# Patient Record
Sex: Female | Born: 1980 | Race: Black or African American | Hispanic: No | Marital: Single | Smoking: Current some day smoker
Health system: Southern US, Community
[De-identification: ages and names within clinical notes are randomized; demographics above are authoritative.]

## PROBLEM LIST (undated history)

## (undated) DIAGNOSIS — R112 Nausea with vomiting, unspecified: Secondary | ICD-10-CM

## (undated) DIAGNOSIS — M79606 Pain in leg, unspecified: Secondary | ICD-10-CM

## (undated) DIAGNOSIS — M2550 Pain in unspecified joint: Secondary | ICD-10-CM

## (undated) DIAGNOSIS — M549 Dorsalgia, unspecified: Secondary | ICD-10-CM

## (undated) DIAGNOSIS — N809 Endometriosis, unspecified: Secondary | ICD-10-CM

## (undated) DIAGNOSIS — K219 Gastro-esophageal reflux disease without esophagitis: Secondary | ICD-10-CM

## (undated) DIAGNOSIS — R0600 Dyspnea, unspecified: Secondary | ICD-10-CM

## (undated) DIAGNOSIS — Z9889 Other specified postprocedural states: Secondary | ICD-10-CM

## (undated) HISTORY — DX: Pain in leg, unspecified: M79.606

## (undated) HISTORY — DX: Dyspnea, unspecified: R06.00

## (undated) HISTORY — DX: Dorsalgia, unspecified: M54.9

## (undated) HISTORY — DX: Pain in unspecified joint: M25.50

---

## 2001-01-17 HISTORY — PX: OVARIAN CYST SURGERY: SHX726

## 2002-01-29 ENCOUNTER — Inpatient Hospital Stay (HOSPITAL_COMMUNITY): Admission: EM | Admit: 2002-01-29 | Discharge: 2002-01-30 | Payer: Self-pay | Admitting: Emergency Medicine

## 2003-05-01 ENCOUNTER — Emergency Department (HOSPITAL_COMMUNITY): Admission: EM | Admit: 2003-05-01 | Discharge: 2003-05-02 | Payer: Self-pay | Admitting: Emergency Medicine

## 2003-05-20 ENCOUNTER — Encounter: Admission: RE | Admit: 2003-05-20 | Discharge: 2003-05-20 | Payer: Self-pay | Admitting: Orthopedic Surgery

## 2004-03-18 ENCOUNTER — Other Ambulatory Visit: Admission: RE | Admit: 2004-03-18 | Discharge: 2004-03-18 | Payer: Self-pay | Admitting: Gynecology

## 2004-08-20 ENCOUNTER — Emergency Department (HOSPITAL_COMMUNITY): Admission: EM | Admit: 2004-08-20 | Discharge: 2004-08-20 | Payer: Self-pay | Admitting: Family Medicine

## 2011-12-09 ENCOUNTER — Emergency Department (HOSPITAL_COMMUNITY)
Admission: EM | Admit: 2011-12-09 | Discharge: 2011-12-10 | Disposition: A | Discharge status: Home / Self Care | Payer: Self-pay | Attending: Emergency Medicine | Admitting: Emergency Medicine

## 2011-12-09 ENCOUNTER — Encounter (HOSPITAL_COMMUNITY): Payer: Self-pay | Admitting: *Deleted

## 2011-12-09 DIAGNOSIS — R1110 Vomiting, unspecified: Secondary | ICD-10-CM | POA: Insufficient documentation

## 2011-12-09 DIAGNOSIS — Z79899 Other long term (current) drug therapy: Secondary | ICD-10-CM | POA: Insufficient documentation

## 2011-12-09 DIAGNOSIS — F17200 Nicotine dependence, unspecified, uncomplicated: Secondary | ICD-10-CM | POA: Insufficient documentation

## 2011-12-09 DIAGNOSIS — K219 Gastro-esophageal reflux disease without esophagitis: Secondary | ICD-10-CM | POA: Insufficient documentation

## 2011-12-09 DIAGNOSIS — K567 Ileus, unspecified: Secondary | ICD-10-CM

## 2011-12-09 DIAGNOSIS — K560 Paralytic ileus: Secondary | ICD-10-CM | POA: Insufficient documentation

## 2011-12-09 HISTORY — DX: Gastro-esophageal reflux disease without esophagitis: K21.9

## 2011-12-09 LAB — POCT PREGNANCY, URINE: Preg Test, Ur: NEGATIVE

## 2011-12-09 MED ORDER — GI COCKTAIL ~~LOC~~: 30.0000 mL | Freq: Once | ORAL | Status: DC

## 2011-12-09 MED ORDER — FAMOTIDINE 20 MG PO TABS
20.0000 mg | ORAL_TABLET | Freq: Once | ORAL | Status: AC
  Administered 2011-12-09: 20 mg via ORAL
  Filled 2011-12-09: qty 1

## 2011-12-09 MED ORDER — GI COCKTAIL ~~LOC~~
30.0000 mL | Freq: Once | ORAL | Status: AC
  Administered 2011-12-09: 30 mL via ORAL
  Filled 2011-12-09: qty 30

## 2011-12-09 NOTE — ED Notes (Addendum)
Pt states that her acid reflux has been "acting up since Sunday" Pt having difficulty "keeping things down" She feels like her food sits in her throat after she eats it. She has been taking prescribed medications for her reflux with no relief. Pt having difficulty swallowing and catching her breath at times. Denies SOB or pain at this time.

## 2011-12-09 NOTE — ED Notes (Signed)
H/o acid reflux, takes carafate, dx'd in 2011, sx started to return Sunday, reports: pressure in throat, burping, gagging, spits up d/t gagging, (denies: fever, abd chest or back pain, nvd), no relief with carafate. Alert, NAD, calm, interactive, skin W&D, resps e/u, speaking in clear complete sentences. LS CTA, throat (clearly visualized) mildly red & irritated, no swelling or exudate.

## 2011-12-10 ENCOUNTER — Emergency Department (HOSPITAL_COMMUNITY): Payer: Self-pay

## 2011-12-10 LAB — CBC WITH DIFFERENTIAL/PLATELET
Eosinophils Absolute: 0.1 10*3/uL (ref 0.0–0.7)
Eosinophils Relative: 2 % (ref 0–5)
Hemoglobin: 13.7 g/dL (ref 12.0–15.0)
Lymphocytes Relative: 27 % (ref 12–46)
Lymphs Abs: 1.9 10*3/uL (ref 0.7–4.0)
MCH: 29.8 pg (ref 26.0–34.0)
MCV: 86.5 fL (ref 78.0–100.0)
Monocytes Relative: 9 % (ref 3–12)
Neutrophils Relative %: 62 % (ref 43–77)
Platelets: 250 10*3/uL (ref 150–400)
RBC: 4.6 MIL/uL (ref 3.87–5.11)
WBC: 6.8 10*3/uL (ref 4.0–10.5)

## 2011-12-10 LAB — POCT I-STAT, CHEM 8
BUN: 10 mg/dL (ref 6–23)
Calcium, Ion: 1.2 mmol/L (ref 1.12–1.23)
Chloride: 103 mEq/L (ref 96–112)
Glucose, Bld: 83 mg/dL (ref 70–99)
TCO2: 24 mmol/L (ref 0–100)

## 2011-12-10 LAB — HEPATIC FUNCTION PANEL
ALT: 10 U/L (ref 0–35)
Albumin: 4.2 g/dL (ref 3.5–5.2)
Alkaline Phosphatase: 59 U/L (ref 39–117)
Total Bilirubin: 0.6 mg/dL (ref 0.3–1.2)
Total Protein: 7.5 g/dL (ref 6.0–8.3)

## 2011-12-10 MED ORDER — SUCRALFATE 1 GM/10ML PO SUSP: 1.0000 g | Freq: Four times a day (QID) | ORAL | Status: DC

## 2011-12-10 MED ORDER — PROMETHAZINE HCL 12.5 MG RE SUPP: 12.5000 mg | Freq: Four times a day (QID) | RECTAL | Status: DC | PRN

## 2011-12-10 MED ORDER — OMEPRAZOLE 20 MG PO CPDR: 20.0000 mg | DELAYED_RELEASE_CAPSULE | Freq: Every day | ORAL | Status: DC

## 2011-12-10 NOTE — ED Notes (Signed)
TRANSPORTED TO X-RAY. 

## 2011-12-10 NOTE — ED Provider Notes (Addendum)
History     CSN: [id number]  Arrival date & time 12/09/11  2045   First MD Initiated Contact with Patient 12/09/11 2304      Chief Complaint  Patient presents with  . Gastrophageal Reflux    (Consider location/radiation/quality/duration/timing/severity/associated sxs/prior treatment) Patient is a 31 y.o. female presenting with GERD and vomiting. The history is provided by the patient.  Gastrophageal Reflux This is a chronic problem. The current episode started more than 1 week ago. The problem occurs constantly. The problem has not changed since onset.Pertinent negatives include no chest pain, no abdominal pain, no headaches and no shortness of breath. Nothing aggravates the symptoms. Nothing relieves the symptoms. She has tried nothing for the symptoms. The treatment provided no relief.  Emesis  This is a recurrent problem. The current episode started 2 days ago. The problem occurs 2 to 4 times per day. The problem has not changed since onset.The emesis has an appearance of stomach contents. There has been no fever. Pertinent negatives include no abdominal pain, no diarrhea and no headaches.    Past Medical History  Diagnosis Date  . GERD (gastroesophageal reflux disease)     Past Surgical History  Procedure Date  . Ovarian cyst surgery     No family history on file.  History  Substance Use Topics  . Smoking status: Current Every Day Smoker  . Smokeless tobacco: Not on file  . Alcohol Use: No    OB History    Grav Para Term Preterm Abortions TAB SAB Ect Mult Living                  Review of Systems  Respiratory: Negative for shortness of breath.   Cardiovascular: Negative for chest pain.  Gastrointestinal: Positive for vomiting. Negative for abdominal pain and diarrhea.  Neurological: Negative for headaches.  All other systems reviewed and are negative.    Allergies  Review of patient's allergies indicates no known allergies.  Home Medications   Current  Outpatient Rx  Name  Route  Sig  Dispense  Refill  . DM-GUAIFENESIN ER 30-600 MG PO TB12   Oral   Take 1 tablet by mouth every 12 (twelve) hours.         Marland Kitchen ONE-DAILY MULTI VITAMINS PO TABS   Oral   Take 1 tablet by mouth daily.         . SUCRALFATE 1 G PO TABS   Oral   Take 1 g by mouth 4 (four) times daily.           BP 135/89  Pulse 99  Temp 98.2 F (36.8 C) (Oral)  Resp 20  SpO2 100%  LMP 11/16/2011  Physical Exam  Constitutional: She is oriented to person, place, and time. She appears well-developed and well-nourished. No distress.  HENT:  Head: Normocephalic and atraumatic.  Mouth/Throat: Oropharynx is clear and moist.  Eyes: Conjunctivae normal are normal. Pupils are equal, round, and reactive to light.  Neck: Normal range of motion. Neck supple.  Cardiovascular: Normal rate and regular rhythm.   Pulmonary/Chest: Effort normal and breath sounds normal. She has no wheezes. She has no rales.  Abdominal: Soft. Bowel sounds are normal. There is no tenderness. There is no rebound and no guarding.  Musculoskeletal: Normal range of motion.  Neurological: She is alert and oriented to person, place, and time.  Skin: Skin is warm and dry.  Psychiatric: She has a normal mood and affect.    ED Course  Procedures (  including critical care time)   Labs Reviewed  CBC WITH DIFFERENTIAL  HEPATIC FUNCTION PANEL  POCT PREGNANCY, URINE  POCT I-STAT, CHEM 8   No results found.   No diagnosis found.    MDM  Mild ileus, exam vital benign is holding down PO and passing gas and stool.  Will D/c.  Return for pain, intractable vomiting fevers or any concerns.  Patient verbalizes understanding and agrees to follow up        Griffen Frayne K Niyam Bisping-Rasch, MD 12/10/11 0125  Codi Folkerts K Keyshawn Hellwig-Rasch, MD 12/10/11 1610

## 2012-05-04 ENCOUNTER — Emergency Department (HOSPITAL_COMMUNITY)
Admission: EM | Admit: 2012-05-04 | Discharge: 2012-05-04 | Discharge status: Absent without leave | Payer: Self-pay | Source: Home / Self Care

## 2012-08-17 HISTORY — PX: COLPOSCOPY: SHX161

## 2013-12-24 ENCOUNTER — Encounter (HOSPITAL_COMMUNITY): Payer: Self-pay | Admitting: *Deleted

## 2013-12-25 ENCOUNTER — Encounter (HOSPITAL_COMMUNITY): Payer: Self-pay

## 2013-12-25 ENCOUNTER — Ambulatory Visit (HOSPITAL_COMMUNITY)
Admission: RE | Admit: 2013-12-25 | Discharge: 2013-12-25 | Disposition: A | Discharge status: Home / Self Care | Payer: Self-pay | Source: Ambulatory Visit | Attending: Obstetrics and Gynecology | Admitting: Obstetrics and Gynecology

## 2013-12-25 VITALS — BP 116/80 | Temp 98.3°F | Ht 65.0 in | Wt 200.2 lb

## 2013-12-25 DIAGNOSIS — Z1239 Encounter for other screening for malignant neoplasm of breast: Secondary | ICD-10-CM

## 2013-12-25 DIAGNOSIS — R87610 Atypical squamous cells of undetermined significance on cytologic smear of cervix (ASC-US): Secondary | ICD-10-CM

## 2013-12-25 DIAGNOSIS — R87810 Cervical high risk human papillomavirus (HPV) DNA test positive: Secondary | ICD-10-CM

## 2013-12-25 HISTORY — DX: Endometriosis, unspecified: N80.9

## 2013-12-25 NOTE — Patient Instructions (Signed)
Explained to [employer] the follow-up needed for her abnormal Pap smear. Referred patient to the [facility] for a colposcopy per recommendation to follow-up for abnormal Pap smear. Appointment scheduled for Friday, December 27, 2013 at 1000. Patient aware of appointment and will be there. Informed patient she will need a screening mammogram at age 33 unless clinically indicated prior. Smoking cessation discussed and resources given for free smoking cessation classes. Abbe Mazzarella verbalized understanding.  Chaunce Winkels, Arvil Chaco, RN 12:02 PM

## 2013-12-25 NOTE — Progress Notes (Signed)
Patient referred to [facility] by the [facility] Department for follow-up of abnormal Pap smear on 10/01/2013.  Pap Smear: Pap smear not completed today. Last Pap smear was 10/01/2013 at the [facility] Department and ASCUS HPV+. Referred patient to the [place] for a colposcopy per recommendation to follow-up for abnormal Pap smear. Appointment scheduled for Friday, December 27, 2013 at 1000. Per patient has a history of an abnormal Pap smear August 2014 at Dr. Tilden Dome office that required a colposcopy for follow-up. Last Pap smear result is scanned in to [facility] under media.  Physical exam: Breasts Breasts symmetrical. No skin abnormalities bilateral breasts. No nipple retraction bilateral breasts. No nipple discharge bilateral breasts. No lymphadenopathy. No lumps palpated bilateral breasts. No complaints of pain or tenderness on exam. Screening mammogram recommended at age 46 unless clinically indicated prior.  Pelvic/Bimanual No Pap smear completed today since last Pap smear was 10/01/2013. Pap smear not indicated per [facility] guidelines.   Smoking cessation discussed with patient. Referred patient to the [facility] Quitline and gave resources to free classes offered at the [facility].

## 2013-12-27 ENCOUNTER — Encounter: Payer: Self-pay | Admitting: Medical

## 2013-12-27 ENCOUNTER — Ambulatory Visit (INDEPENDENT_AMBULATORY_CARE_PROVIDER_SITE_OTHER): Payer: Self-pay | Admitting: Medical

## 2013-12-27 ENCOUNTER — Other Ambulatory Visit (HOSPITAL_COMMUNITY)
Admission: RE | Admit: 2013-12-27 | Discharge: 2013-12-27 | Disposition: A | Discharge status: Home / Self Care | Payer: Self-pay | Source: Ambulatory Visit | Attending: Medical | Admitting: Medical

## 2013-12-27 DIAGNOSIS — IMO0002 Reserved for concepts with insufficient information to code with codable children: Secondary | ICD-10-CM

## 2013-12-27 DIAGNOSIS — R896 Abnormal cytological findings in specimens from other organs, systems and tissues: Secondary | ICD-10-CM

## 2013-12-27 DIAGNOSIS — N942 Vaginismus: Secondary | ICD-10-CM

## 2013-12-27 LAB — POCT PREGNANCY, URINE: PREG TEST UR: NEGATIVE

## 2013-12-27 NOTE — Progress Notes (Signed)
Pt is concerned about this is her second colposcopy and cervical cancer runs in her family.

## 2013-12-27 NOTE — Patient Instructions (Signed)

## 2013-12-27 NOTE — Progress Notes (Signed)
Patient ID: Cheryl Friedman, female   DOB: 1980/10/11, 33 y.o.   MRN: [id number]     GYNECOLOGY CLINIC COLPOSCOPY PROCEDURE NOTE  33 y.o. G1P0010 here for colposcopy for ASCUS with POSITIVE high risk HPV pap smear on 10/01/13. Discussed role for HPV in cervical dysplasia, need for surveillance. Patient states that this is second colposcopy for abnormal pap smear, last performed last year. Patient has not required LEEP or cryotherapy. Patient encouraged to stop smoking.   Patient given informed consent, signed copy in the chart, time out was performed.  Placed in lithotomy position. Cervix viewed with speculum and colposcope after application of acetic acid.   Colposcopy adequate? Yes  acetowhite lesion(s) noted at 12 o'clock; biopsies obtained.  ECC specimen obtained. All specimens were labelled and sent to pathology.  Patient was given post procedure instructions.  Will follow up pathology and manage accordingly.  Routine preventative health maintenance measures emphasized.  Dr. Kennon Rounds present for procedure  Luvenia Redden, PA-C 12/27/2013 11:17 AM

## 2014-01-01 ENCOUNTER — Telehealth: Payer: Self-pay

## 2014-01-01 NOTE — Telephone Encounter (Signed)
Called patient and informed her of results and recommendations. Patient verbalized understanding. No questions or concerns.  

## 2014-01-01 NOTE — Telephone Encounter (Signed)
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 We will just be watching this, no treatment needed at this time. Repeat pap with HPV in 1 year in our clinic.   Thanks,   Almyra Free

## 2014-01-03 ENCOUNTER — Encounter: Payer: Self-pay | Admitting: *Deleted

## 2014-01-19 ENCOUNTER — Encounter (HOSPITAL_COMMUNITY): Payer: Self-pay | Admitting: Emergency Medicine

## 2014-01-19 ENCOUNTER — Emergency Department (HOSPITAL_COMMUNITY)
Admission: EM | Admit: 2014-01-19 | Discharge: 2014-01-20 | Disposition: A | Discharge status: Home / Self Care | Payer: Self-pay | Attending: Emergency Medicine | Admitting: Emergency Medicine

## 2014-01-19 ENCOUNTER — Emergency Department (HOSPITAL_COMMUNITY): Payer: Self-pay

## 2014-01-19 DIAGNOSIS — Z87891 Personal history of nicotine dependence: Secondary | ICD-10-CM | POA: Insufficient documentation

## 2014-01-19 DIAGNOSIS — Z79899 Other long term (current) drug therapy: Secondary | ICD-10-CM | POA: Insufficient documentation

## 2014-01-19 DIAGNOSIS — R3589 Other polyuria: Secondary | ICD-10-CM

## 2014-01-19 DIAGNOSIS — R000 Tachycardia, unspecified: Secondary | ICD-10-CM | POA: Insufficient documentation

## 2014-01-19 DIAGNOSIS — Z8719 Personal history of other diseases of the digestive system: Secondary | ICD-10-CM | POA: Insufficient documentation

## 2014-01-19 DIAGNOSIS — R69 Illness, unspecified: Secondary | ICD-10-CM

## 2014-01-19 DIAGNOSIS — Z8742 Personal history of other diseases of the female genital tract: Secondary | ICD-10-CM | POA: Insufficient documentation

## 2014-01-19 DIAGNOSIS — Z3202 Encounter for pregnancy test, result negative: Secondary | ICD-10-CM | POA: Insufficient documentation

## 2014-01-19 DIAGNOSIS — R358 Other polyuria: Secondary | ICD-10-CM | POA: Insufficient documentation

## 2014-01-19 DIAGNOSIS — J111 Influenza due to unidentified influenza virus with other respiratory manifestations: Secondary | ICD-10-CM | POA: Insufficient documentation

## 2014-01-19 LAB — CBC WITH DIFFERENTIAL/PLATELET
BASOS ABS: 0 10*3/uL (ref 0.0–0.1)
Basophils Relative: 1 % (ref 0–1)
Eosinophils Absolute: 0.3 10*3/uL (ref 0.0–0.7)
Eosinophils Relative: 5 % (ref 0–5)
HEMATOCRIT: 44.7 % (ref 36.0–46.0)
Hemoglobin: 14.7 g/dL (ref 12.0–15.0)
LYMPHS PCT: 29 % (ref 12–46)
Lymphs Abs: 1.9 10*3/uL (ref 0.7–4.0)
MCH: 29.1 pg (ref 26.0–34.0)
MCHC: 32.9 g/dL (ref 30.0–36.0)
MCV: 88.3 fL (ref 78.0–100.0)
Monocytes Absolute: 0.6 10*3/uL (ref 0.1–1.0)
Monocytes Relative: 9 % (ref 3–12)
NEUTROS ABS: 3.7 10*3/uL (ref 1.7–7.7)
Neutrophils Relative %: 56 % (ref 43–77)
PLATELETS: 283 10*3/uL (ref 150–400)
RBC: 5.06 MIL/uL (ref 3.87–5.11)
RDW: 12.8 % (ref 11.5–15.5)
WBC: 6.6 10*3/uL (ref 4.0–10.5)

## 2014-01-19 LAB — URINALYSIS, ROUTINE W REFLEX MICROSCOPIC
Bilirubin Urine: NEGATIVE
Glucose, UA: NEGATIVE mg/dL
KETONES UR: NEGATIVE mg/dL
NITRITE: NEGATIVE
PH: 7 (ref 5.0–8.0)
Protein, ur: NEGATIVE mg/dL
SPECIFIC GRAVITY, URINE: 1.024 (ref 1.005–1.030)
UROBILINOGEN UA: 1 mg/dL (ref 0.0–1.0)

## 2014-01-19 LAB — COMPREHENSIVE METABOLIC PANEL
ALBUMIN: 4.1 g/dL (ref 3.5–5.2)
ALT: 13 U/L (ref 0–35)
AST: 19 U/L (ref 0–37)
Alkaline Phosphatase: 73 U/L (ref 39–117)
Anion gap: 6 (ref 5–15)
BILIRUBIN TOTAL: 0.5 mg/dL (ref 0.3–1.2)
BUN: 7 mg/dL (ref 6–23)
CO2: 26 mmol/L (ref 19–32)
CREATININE: 0.91 mg/dL (ref 0.50–1.10)
Calcium: 9.4 mg/dL (ref 8.4–10.5)
Chloride: 103 mEq/L (ref 96–112)
GFR calc Af Amer: >90 mL/min (ref >90)
GFR, EST NON AFRICAN AMERICAN: 82 mL/min — AB (ref >90)
Glucose, Bld: 93 mg/dL (ref 70–99)
Potassium: 4 mmol/L (ref 3.5–5.1)
Sodium: 135 mmol/L (ref 135–145)
Total Protein: 7.5 g/dL (ref 6.0–8.3)

## 2014-01-19 LAB — URINE MICROSCOPIC-ADD ON

## 2014-01-19 LAB — POC URINE PREG, ED: Preg Test, Ur: NEGATIVE

## 2014-01-19 MED ORDER — PREDNISONE 20 MG PO TABS: 60.0000 mg | ORAL_TABLET | Freq: Every day | ORAL | Status: AC

## 2014-01-19 MED ORDER — IBUPROFEN 800 MG PO TABS: 800.0000 mg | ORAL_TABLET | Freq: Three times a day (TID) | ORAL | Status: DC

## 2014-01-19 MED ORDER — IBUPROFEN 800 MG PO TABS: 800.0000 mg | ORAL_TABLET | Freq: Three times a day (TID) | ORAL | Status: AC

## 2014-01-19 MED ORDER — SODIUM CHLORIDE 0.9 % IV BOLUS (SEPSIS)
1000.0000 mL | Freq: Once | INTRAVENOUS | Status: AC
  Administered 2014-01-19: 1000 mL via INTRAVENOUS

## 2014-01-19 MED ORDER — KETOROLAC TROMETHAMINE 30 MG/ML IJ SOLN
30.0000 mg | Freq: Once | INTRAMUSCULAR | Status: AC
  Administered 2014-01-19: 30 mg via INTRAVENOUS
  Filled 2014-01-19: qty 1

## 2014-01-19 MED ORDER — DEXAMETHASONE SODIUM PHOSPHATE 4 MG/ML IJ SOLN
10.0000 mg | Freq: Once | INTRAMUSCULAR | Status: AC
  Administered 2014-01-19: 10 mg via INTRAVENOUS
  Filled 2014-01-19: qty 3

## 2014-01-19 MED ORDER — FOSFOMYCIN TROMETHAMINE 3 G PO PACK
3.0000 g | PACK | Freq: Once | ORAL | Status: AC
  Administered 2014-01-19: 3 g via ORAL
  Filled 2014-01-19: qty 3

## 2014-01-19 NOTE — ED Provider Notes (Signed)
CSN: [id number]     Arrival date & time 01/19/14  1902 History   First MD Initiated Contact with Patient 01/19/14 2054     Chief Complaint  Patient presents with  . Emesis  . Fever  . Nasal Congestion     (Consider location/radiation/quality/duration/timing/severity/associated sxs/prior Treatment) HPI This patient presents with 5 days of generalized discomfort, including diffuse soreness, nausea, generalized weakness, chills, cough, congestion. Symptoms began approximately 2 weeks after the patient had a similar illness. Since onset, no relief with OTC medication. Patient is generally well, denies substantial medical problems.  Past Medical History  Diagnosis Date  . GERD (gastroesophageal reflux disease)   . Endometriosis    Past Surgical History  Procedure Laterality Date  . Ovarian cyst surgery  2003  . Colposcopy  08/2012   Family History  Problem Relation Age of Onset  . Hypertension Maternal Aunt   . Hypertension Maternal Grandmother   . Hypertension Maternal Aunt    History  Substance Use Topics  . Smoking status: Former Smoker -- 0.25 packs/day    Types: Cigarettes    Quit date: 10/17/2013  . Smokeless tobacco: Never Used  . Alcohol Use: No   OB History    Gravida Para Term Preterm AB TAB SAB Ectopic Multiple Living   1    1  1         Review of Systems  Constitutional:       Per HPI, otherwise negative  HENT:       Per HPI, otherwise negative  Respiratory:       Per HPI, otherwise negative  Cardiovascular:       Per HPI, otherwise negative  Gastrointestinal: Negative for vomiting.  Endocrine:       Negative aside from HPI  Genitourinary:       Neg aside from HPI   Musculoskeletal:       Per HPI, otherwise negative  Skin: Negative.   Neurological: Negative for syncope.      Allergies  Latex  Home Medications   Prior to Admission medications   Medication Sig Start Date End Date Taking? Authorizing Provider  dextromethorphan-guaiFENesin  (MUCINEX DM) 30-600 MG per 12 hr tablet Take 1 tablet by mouth every 12 (twelve) hours.    Historical Provider, MD  ibuprofen (ADVIL,MOTRIN) 800 MG tablet Take 1 tablet (800 mg total) by mouth 3 (three) times daily. 01/19/14 01/23/14  Carmin Muskrat, MD  medroxyPROGESTERone (DEPO-SUBQ PROVERA 104) 104 MG/0.65ML injection Inject 104 mg into the skin every 3 (three) months.    Historical Provider, MD  Multiple Vitamin (MULTIVITAMIN) tablet Take 1 tablet by mouth daily.    Historical Provider, MD   BP 129/105 mmHg  Pulse 95  Temp(Src) 99.1 F (37.3 C) (Oral)  Resp 20  Ht 5\' 5"  (1.651 m)  Wt 195 lb (88.451 kg)  BMI 32.45 kg/m2  SpO2 100% Physical Exam  Constitutional: She is oriented to person, place, and time. She appears well-developed and well-nourished. No distress.  HENT:  Head: Normocephalic and atraumatic.  Eyes: Conjunctivae and EOM are normal.  Cardiovascular: Regular rhythm.  Tachycardia present.   Pulmonary/Chest: Effort normal and breath sounds normal. No stridor. No respiratory distress.  Abdominal: She exhibits no distension.  Musculoskeletal: She exhibits no edema.  Neurological: She is alert and oriented to person, place, and time. No cranial nerve deficit.  Skin: Skin is warm and dry.  Psychiatric: She has a normal mood and affect.  Nursing note and vitals reviewed.  ED Course  Procedures (including critical care time) Labs Review Labs Reviewed  COMPREHENSIVE METABOLIC PANEL - Abnormal; Notable for the following:    GFR calc non Af Amer 82 (*)    All other components within normal limits  URINALYSIS, ROUTINE W REFLEX MICROSCOPIC - Abnormal; Notable for the following:    APPearance CLOUDY (*)    Hgb urine dipstick SMALL (*)    Leukocytes, UA MODERATE (*)    All other components within normal limits  URINE MICROSCOPIC-ADD ON - Abnormal; Notable for the following:    Squamous Epithelial / LPF FEW (*)    Bacteria, UA FEW (*)    All other components within normal  limits  CBC WITH DIFFERENTIAL  POC URINE PREG, ED    Imaging Review Dg Chest 2 View  01/19/2014   CLINICAL DATA:  Emesis, fever, and nasal congestion for 6 days. Shortness of breath and chest pressure.  EXAM: CHEST  2 VIEW  COMPARISON:  12/10/2011  FINDINGS: The heart size and mediastinal contours are within normal limits. Both lungs are clear. The visualized skeletal structures are unremarkable.  IMPRESSION: No active cardiopulmonary disease.   Electronically Signed   By: Lucienne Capers M.D.   On: 01/19/2014 21:40   On repeat exam the patient states that she feels better. She now acknowledges polyuria, denies dysuria. With abnormal urinalysis, patient received fosfomycin, urine culture will be sent.   MDM   Final diagnoses:  Influenza-like illness  Polyuria    Patient presents with several episodes of flulike symptoms. Here the patient is afebrile, but initially tachycardic. Patient's vital signs improved, she remains in no distress, and has no alarming findings. Patient did have mild polyuria, per report, and was treated for possible urinary tract infection. With otherwise reassuring results, patient was treated symptomatically, discharged in stable condition to follow-up with primary care.     Carmin Muskrat, MD 01/19/14 2350

## 2014-01-19 NOTE — Discharge Instructions (Signed)
As discussed, your evaluation today has been largely reassuring.  But, it is important that you monitor your condition carefully, and do not hesitate to return to the ED if you develop new, or concerning changes in your condition. ? ?Otherwise, please follow-up with your physician for appropriate ongoing care. ? ?

## 2014-01-19 NOTE — ED Notes (Signed)
Denies nausea at this time.  Last vomited this morning.

## 2014-01-19 NOTE — ED Notes (Signed)
Reports "flu-like symptoms" 3 weeks ago that improved and is now getting worse.  Reports fever, chills, vomiting, decreased appetite, dry cough, sore throat, body aches, and congestion.

## 2014-01-19 NOTE — ED Notes (Signed)
Correction- add on urine culture is clean catch

## 2014-01-21 LAB — URINE CULTURE

## 2014-10-15 ENCOUNTER — Encounter (HOSPITAL_COMMUNITY): Payer: Self-pay | Admitting: *Deleted

## 2014-11-05 ENCOUNTER — Other Ambulatory Visit (HOSPITAL_COMMUNITY)
Admission: RE | Admit: 2014-11-05 | Discharge: 2014-11-05 | Disposition: A | Discharge status: Home / Self Care | Payer: Self-pay | Source: Ambulatory Visit | Attending: Family Medicine | Admitting: Family Medicine

## 2014-11-05 ENCOUNTER — Ambulatory Visit (INDEPENDENT_AMBULATORY_CARE_PROVIDER_SITE_OTHER): Payer: Self-pay | Admitting: Family Medicine

## 2014-11-05 ENCOUNTER — Encounter: Payer: Self-pay | Admitting: Family Medicine

## 2014-11-05 VITALS — BP 137/97 | HR 93 | Temp 98.9°F | Ht 61.0 in | Wt 204.9 lb

## 2014-11-05 DIAGNOSIS — R87619 Unspecified abnormal cytological findings in specimens from cervix uteri: Secondary | ICD-10-CM | POA: Insufficient documentation

## 2014-11-05 DIAGNOSIS — Z3202 Encounter for pregnancy test, result negative: Secondary | ICD-10-CM

## 2014-11-05 DIAGNOSIS — IMO0002 Reserved for concepts with insufficient information to code with codable children: Secondary | ICD-10-CM | POA: Insufficient documentation

## 2014-11-05 DIAGNOSIS — B977 Papillomavirus as the cause of diseases classified elsewhere: Secondary | ICD-10-CM

## 2014-11-05 DIAGNOSIS — R8789 Other abnormal findings in specimens from female genital organs: Secondary | ICD-10-CM

## 2014-11-05 LAB — POCT PREGNANCY, URINE: Preg Test, Ur: NEGATIVE

## 2014-11-05 MED ORDER — NORETHINDRONE-ETH ESTRADIOL 1-5 MG-MCG PO TABS: 1.0000 | ORAL_TABLET | Freq: Every day | ORAL | Status: AC

## 2014-11-05 NOTE — Progress Notes (Signed)
   CLINIC ENCOUNTER NOTE  History:  34 y.o. G1P0010 here today for colpo. She denies any abnormal vaginal discharge, bleeding, pelvic pain or other concerns.  She had multiple questions about colpo. She reports she was recently taken off depo and is on a "birth control pill" but does not know name.   She is currently not smoking and using nicotine patches.   See overview of Abnormal pap in problem list of details of testing.  Past Medical History  Diagnosis Date  . GERD (gastroesophageal reflux disease)   . Endometriosis     Past Surgical History  Procedure Laterality Date  . Ovarian cyst surgery  2003  . Colposcopy  08/2012    The following portions of the patient's history were reviewed and updated as appropriate: allergies, current medications, past family history, past medical history, past social history, past surgical history and problem list.     Review of Systems:  Pertinent items noted in HPI and remainder of comprehensive ROS otherwise negative.  Objective:  Physical Exam BP 137/97 mmHg  Pulse 93  Temp(Src) 98.9 F (37.2 C) (Oral)  Ht 5\' 1"  (1.549 m)  Wt 204 lb 14.4 oz (92.942 kg)  BMI 38.74 kg/m2 CONSTITUTIONAL: Well-developed, well-nourished female in no acute distress.  HENT:  Normocephalic, atraumatic. External right and left ear normal. Oropharynx is clear and moist EYES: Conjunctivae and EOM are normal. Pupils are equal, round, and reactive to light. No scleral icterus.  NECK: Normal range of motion, supple, no masses SKIN: Skin is warm and dry. No rash noted. Not diaphoretic. No erythema. No pallor. [place]: Alert and oriented to person, place, and time. Normal reflexes, muscle tone coordination. No cranial nerve deficit noted. PSYCHIATRIC: Normal mood and affect. Normal behavior. Normal judgment and thought content. CARDIOVASCULAR: Normal heart rate noted RESPIRATORY: Effort and breath sounds normal, no problems with respiration noted ABDOMEN: Soft, no  distention noted.   PELVIC: Normal appearing external genitalia; normal appearing vaginal mucosa and cervix.  No abnormal discharge noted.  Normal uterine size, no other palpable masses, no uterine or adnexal tenderness. MUSCULOSKELETAL: Normal range of motion. No edema noted.  GYNECOLOGY CLINIC PROCEDURE NOTE Patient given informed consent, signed copy in the chart, time out was performed.  Placed in lithotomy position. Cervix viewed with speculum and colposcope after application of acetic acid.   Colposcopy adequate? Yes  acetowhite lesion(s) noted at 5-6 o'clock and two small lesions at 2 o'clock; corresponding biopsies obtained.  ECC specimen obtained. All specimens were labelled and sent to pathology. Dr. Verita Schneiders examined patient and agreed with biopsy locations.   Patient was given post procedure instructions.  Will follow up pathology and manage accordingly.  Routine preventative health maintenance measures emphasized.   Assessment & Plan:  1. 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 Tobacco Cessation: Reviewed importance of stopping smoking.  3. Contraceptive planning: reviewed current plan.   Routine preventative health maintenance measures emphasized. Please refer to After Visit Summary for other counseling recommendations.   Return if symptoms worsen or fail to improve.  Total face-to-face time with patient: 30 minutes. Over 50% of encounter was spent on counseling and coordination of care.

## 2014-11-05 NOTE — Patient Instructions (Signed)
Colposcopy  Colposcopy is a procedure to examine your cervix and vagina, or the area around the outside of your vagina, for abnormalities or signs of disease. The procedure is done using a lighted microscope called a colposcope. Tissue samples may be collected during the colposcopy if your health care provider finds any unusual cells. A colposcopy may be done if a woman has:  · An abnormal Pap test. A Pap test is a medical test done to evaluate cells that are on the surface of the cervix.  · A Pap test result that is suggestive of human papillomavirus (HPV). This virus can cause genital warts and is linked to the development of cervical cancer.  · A sore on her cervix and the results of a Pap test were normal.  · Genital warts on the cervix or in or around the outside of the vagina.  · A mother who took the drug diethylstilbestrol (DES) while pregnant.  · Painful intercourse.  · Vaginal bleeding, especially after sexual intercourse.  LET YOUR HEALTH CARE PROVIDER KNOW ABOUT:  · Any allergies you have.  · All medicines you are taking, including vitamins, herbs, eye drops, creams, and over-the-counter medicines.  · Previous problems you or members of your family have had with the use of anesthetics.  · Any blood disorders you have.  · Previous surgeries you have had.  · Medical conditions you have.  RISKS AND COMPLICATIONS  Generally, a colposcopy is a safe procedure. However, as with any procedure, complications can occur. Possible complications include:  · Bleeding.  · Infection.  · Missed lesions.  BEFORE THE PROCEDURE   · Tell your health care provider if you have your menstrual period. A colposcopy typically is not done during menstruation.  · For 24 hours before the colposcopy, do not:    [place]ou[place].    Use tampons.    Use medicines, creams, or suppositories in the vagina.    Have sexual intercourse.  PROCEDURE   During the procedure, you will be lying on your back with your feet in foot rests (stirrups). A warm  metal or plastic instrument (speculum) will be placed in your vagina to keep it open and to allow the health care provider to see the cervix. The colposcope will be placed outside the vagina. It will be used to magnify and examine the cervix, vagina, and the area around the outside of the vagina. A small amount of liquid solution will be placed on the area that is to be viewed. This solution will make it easier to see the abnormal cells. Your health care provider will use tools to suck out mucus and cells from the canal of the cervix. Then he or she will record the location of the abnormal areas.  If a biopsy is done during the procedure, a medicine will usually be given to numb the area (local anesthetic). You may feel mild pain or cramping while the biopsy is done. After the procedure, tissue samples collected during the biopsy will be sent to a lab for analysis.  AFTER THE PROCEDURE   You will be given instructions on when to follow up with your health care provider for your test results. It is important to keep your appointment.     This information is not intended to replace advice given to you by your health care provider. Make sure you discuss any questions you have with your health care provider.     Document Released: 03/26/2002 Document Revised: 09/05/2012 Document Reviewed: 08/02/2012    Elsevier Interactive Patient Education ©2016 [employer]

## 2014-11-13 ENCOUNTER — Telehealth: Payer: Self-pay | Admitting: Family Medicine

## 2014-11-13 DIAGNOSIS — IMO0002 Reserved for concepts with insufficient information to code with codable children: Secondary | ICD-10-CM

## 2014-11-13 NOTE — Telephone Encounter (Signed)
Patient with persistent CIN 1. Will need 12 month follow up pap with HPV testing.  If persistent CIN 1> 2 years (2017) then we can consider treatment with ablative or excision methods.   Caren Macadam, MD

## 2014-11-17 NOTE — Telephone Encounter (Signed)
3:54 PM Spoke with patient and reviewed results from recent colposcopy.  Patient will follow up in Sept 2017 for pap with HPV. If positive will need colposcopy. If this shows continued CIN 1 we can consider treatment with cryo or leep as the patient will have had persistent dysplasia for 2 years.  Caren Macadam, MD

## 2014-11-17 NOTE — Telephone Encounter (Signed)
Patient returned call here at [facility]. Cheryl Friedman, ASA

## 2015-07-27 ENCOUNTER — Encounter (HOSPITAL_COMMUNITY): Payer: Self-pay | Admitting: *Deleted

## 2016-01-21 ENCOUNTER — Encounter: Payer: Self-pay | Admitting: Physician Assistant

## 2016-01-21 ENCOUNTER — Ambulatory Visit: Payer: Self-pay | Admitting: Physician Assistant

## 2016-01-21 VITALS — BP 110/80 | HR 73 | Temp 98.4°F

## 2016-01-21 DIAGNOSIS — J069 Acute upper respiratory infection, unspecified: Secondary | ICD-10-CM

## 2016-01-21 LAB — POCT INFLUENZA A/B
INFLUENZA A, POC: NEGATIVE
Influenza B, POC: NEGATIVE

## 2016-01-21 MED ORDER — AZITHROMYCIN 250 MG PO TABS: ORAL_TABLET | ORAL | 0 refills | Status: DC

## 2016-01-21 MED ORDER — FLUCONAZOLE 150 MG PO TABS: 150.0000 mg | ORAL_TABLET | Freq: Once | ORAL | 0 refills | Status: AC

## 2016-01-21 MED ORDER — FLUCONAZOLE 150 MG PO TABS: 150.0000 mg | ORAL_TABLET | Freq: Once | ORAL | 0 refills | Status: DC

## 2016-01-21 MED ORDER — BENZONATATE 200 MG PO CAPS: 200.0000 mg | ORAL_CAPSULE | Freq: Three times a day (TID) | ORAL | 0 refills | Status: DC | PRN

## 2016-01-21 NOTE — Progress Notes (Signed)
S: C/o runny nose and congestion with nighttime fevers and body aches for 3 days, denies cp/sob, v/d; mucus was green  Using otc meds: cold meds  O: PE: vitals wnl, nad, perrl eomi, normocephalic, tms dull, nasal mucosa red and swollen, throat injected, neck supple no lymph, lungs c t a, cv rrr, neuro intact, flu swab neg  A:  Acute  uri   P: drink fluids, continue regular meds , use otc meds of choice, return if not improving in 5 days, return earlier if worsening  Zpack, tessalon perls, diflucan if needed

## 2016-01-28 ENCOUNTER — Encounter: Payer: Self-pay | Admitting: Physician Assistant

## 2016-01-28 ENCOUNTER — Ambulatory Visit: Payer: Self-pay | Admitting: Physician Assistant

## 2016-01-28 VITALS — BP 110/60 | HR 99 | Temp 98.3°F

## 2016-01-28 DIAGNOSIS — J0100 Acute maxillary sinusitis, unspecified: Secondary | ICD-10-CM

## 2016-01-28 MED ORDER — AMOXICILLIN 875 MG PO TABS: 875.0000 mg | ORAL_TABLET | Freq: Two times a day (BID) | ORAL | 0 refills | Status: DC

## 2016-01-28 MED ORDER — PREDNISONE 10 MG PO TABS: 30.0000 mg | ORAL_TABLET | Freq: Every day | ORAL | 0 refills | Status: DC

## 2016-01-28 MED ORDER — FLUCONAZOLE 150 MG PO TABS: 150.0000 mg | ORAL_TABLET | Freq: Once | ORAL | 0 refills | Status: AC

## 2016-01-28 NOTE — Progress Notes (Signed)
S: C/o runny nose and congestion for 3 days, had bronchitis last week, is a little better from the zpack, but as soon as she finished the antibiotic the head congestion started, no fever, chills, cp/sob, v/d; mucus is green. Bloody and thick, cough is sporadic, c/o of facial and dental pain.   Using otc meds:   O: PE: vitals wnl, nad perrl eomi, normocephalic, tms dull, nasal mucosa red and swollen, throat injected, neck supple no lymph, lungs c t a, cv rrr, neuro intact  A:  Acute sinusitis   P: drink fluids, continue regular meds , use otc meds of choice, return if not improving in 5 days, return earlier if worsening , prednisone 30mg , amoxil, diflucan

## 2016-02-22 ENCOUNTER — Ambulatory Visit
Admission: RE | Admit: 2016-02-22 | Discharge: 2016-02-22 | Disposition: A | Discharge status: Home / Self Care | Payer: Managed Care, Other (non HMO) | Source: Ambulatory Visit | Attending: Physician Assistant | Admitting: Physician Assistant

## 2016-02-22 ENCOUNTER — Encounter: Payer: Self-pay | Admitting: Physician Assistant

## 2016-02-22 ENCOUNTER — Ambulatory Visit: Payer: Self-pay | Admitting: Physician Assistant

## 2016-02-22 VITALS — BP 120/70 | HR 89 | Temp 98.6°F

## 2016-02-22 DIAGNOSIS — J209 Acute bronchitis, unspecified: Secondary | ICD-10-CM | POA: Insufficient documentation

## 2016-02-22 DIAGNOSIS — R05 Cough: Secondary | ICD-10-CM | POA: Diagnosis present

## 2016-02-22 MED ORDER — ALBUTEROL SULFATE HFA 108 (90 BASE) MCG/ACT IN AERS: 2.0000 | INHALATION_SPRAY | Freq: Four times a day (QID) | RESPIRATORY_TRACT | 0 refills | Status: DC | PRN

## 2016-02-22 MED ORDER — METHYLPREDNISOLONE 4 MG PO TBPK: ORAL_TABLET | ORAL | 0 refills | Status: DC

## 2016-02-22 NOTE — Progress Notes (Signed)
S: C/o cough and congestion with wheezing and chest pain, chest is sore from coughing, denies fever, chills; or cough is dry and hacking; keeping pt awake at night;  denies cardiac type chest pain or sob, v/d, abd pain, sick since Jan, used zpack, amoxil, and 3d pred burst, green mucus is gone but still has head congestion, cough and wheezing Remainder ros neg  O: vitals wnl, nad, tms clear, throat injected, neck supple no lymph, lungs with wheezing, cv rrr, neuro intact  A:  Acute bronchitis   P:  rx medication: albuterol inhaler, medrol dose pack, cxr ap and lateral, use otc meds, tylenol or motrin as needed for fever/chills, return if not better in 3 -5 days, return earlier if worsening, consider ENT referral for head congestion if not improving with otc meds

## 2016-03-30 ENCOUNTER — Other Ambulatory Visit: Payer: Self-pay | Admitting: Obstetrics and Gynecology

## 2016-03-30 DIAGNOSIS — R1900 Intra-abdominal and pelvic swelling, mass and lump, unspecified site: Secondary | ICD-10-CM

## 2016-04-04 ENCOUNTER — Other Ambulatory Visit: Payer: Self-pay

## 2016-04-15 ENCOUNTER — Ambulatory Visit
Admission: RE | Admit: 2016-04-15 | Discharge: 2016-04-15 | Disposition: A | Discharge status: Home / Self Care | Payer: Managed Care, Other (non HMO) | Source: Ambulatory Visit | Attending: Obstetrics and Gynecology | Admitting: Obstetrics and Gynecology

## 2016-04-15 DIAGNOSIS — R1900 Intra-abdominal and pelvic swelling, mass and lump, unspecified site: Secondary | ICD-10-CM

## 2016-04-15 MED ORDER — IOPAMIDOL (ISOVUE-300) INJECTION 61%
100.0000 mL | Freq: Once | INTRAVENOUS | Status: AC | PRN
  Administered 2016-04-15: 100 mL via INTRAVENOUS

## 2016-04-25 ENCOUNTER — Other Ambulatory Visit: Payer: Self-pay | Admitting: Obstetrics and Gynecology

## 2016-04-25 DIAGNOSIS — Q519 Congenital malformation of uterus and cervix, unspecified: Secondary | ICD-10-CM

## 2016-04-29 ENCOUNTER — Ambulatory Visit
Admission: RE | Admit: 2016-04-29 | Discharge: 2016-04-29 | Disposition: A | Discharge status: Home / Self Care | Payer: Managed Care, Other (non HMO) | Source: Ambulatory Visit | Attending: Obstetrics and Gynecology | Admitting: Obstetrics and Gynecology

## 2016-04-29 DIAGNOSIS — Q519 Congenital malformation of uterus and cervix, unspecified: Secondary | ICD-10-CM

## 2016-04-29 IMAGING — MR MR PELVIS WO/W CM
7 of 10 series · 36 of 48 positions shown · IV contrast (multihance)
Comparison: CT [DATE].  Outside ultrasound not available.

CLINICAL DATA: Possible left ovarian mass on outside ultrasound. CT
demonstrating findings suspicious for uterine duplication anomaly.

EXAM:
MRI PELVIS WITHOUT AND WITH CONTRAST
TECHNIQUE: Multiplanar multisequence MR imaging of the pelvis was performed
both before and after administration of intravenous contrast.
CONTRAST:  20 cc MultiHance

[Series 3: cor haste · coronal · 6.0mm · 0.78mm/px · 6 of 26 slices shown]
[im 1/26]
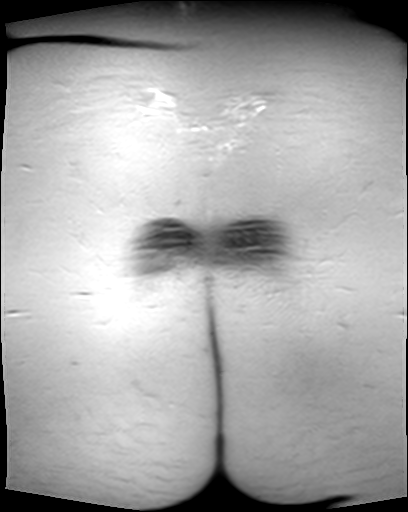
[im 6/26]
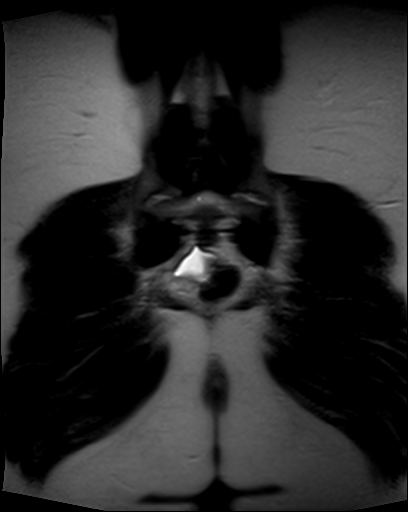
[im 11/26]
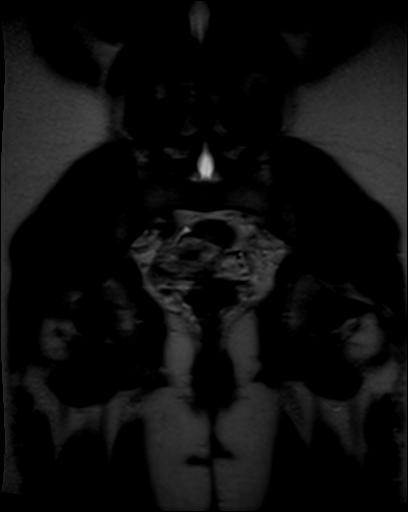
[im 16/26]
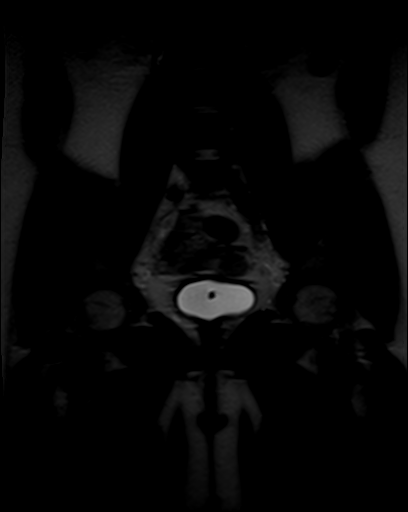
[im 21/26]
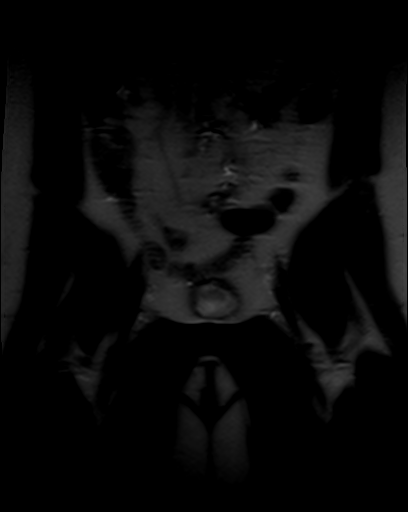
[im 26/26]
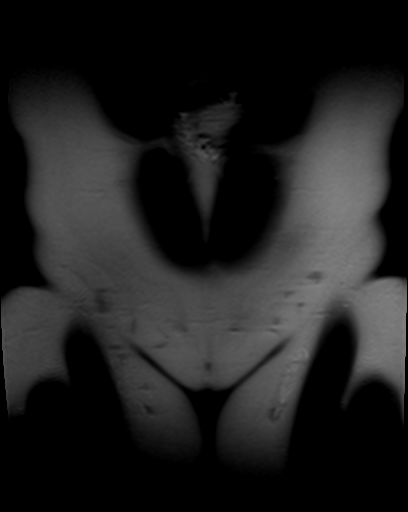

[Series 4: t2_tse_sag · sagittal · 5.0mm · 0.57mm/px · 4 of 20 slices shown]
[im 1/20]
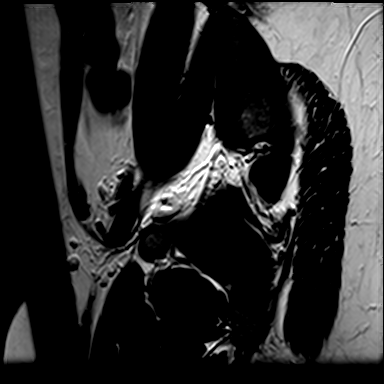
[im 7/20]
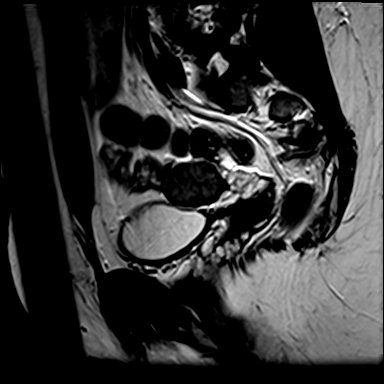
[im 13/20]
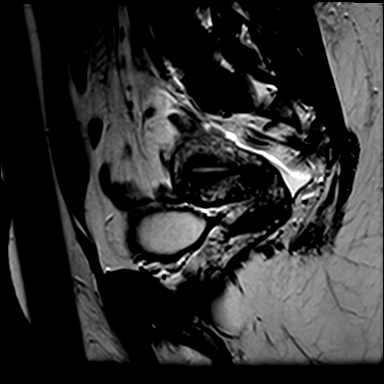
[im 20/20]
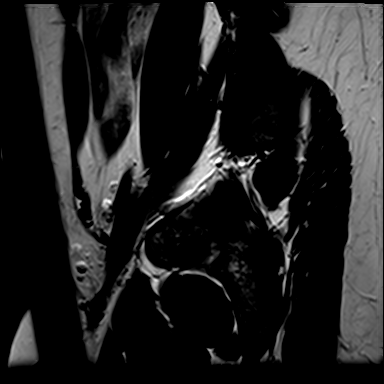

[Series 7: T2 · coronal · 5.0mm · 0.86mm/px · 5 of 21 slices shown]
[im 1/21]
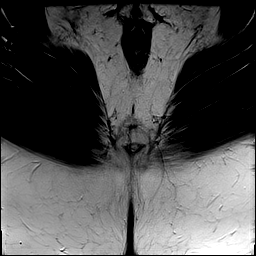
[im 6/21]
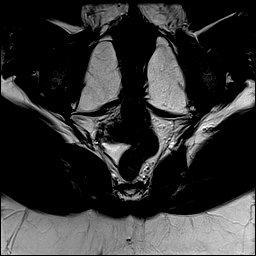
[im 11/21]
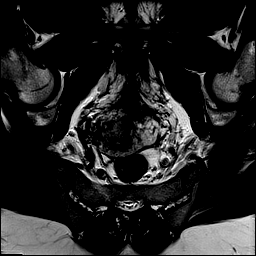
[im 16/21]
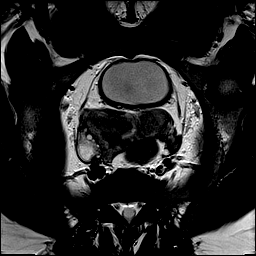
[im 21/21]
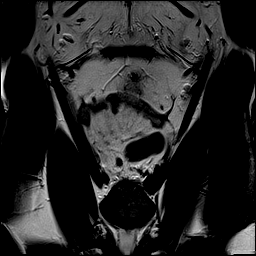

[Series 10: obl axial t2fs · coronal · 5.0mm · 0.86mm/px · 5 of 21 slices shown]
[im 1/21]
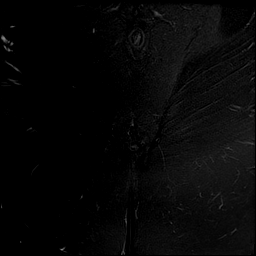
[im 6/21]
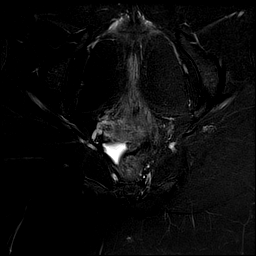
[im 11/21]
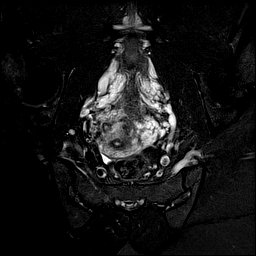
[im 16/21]
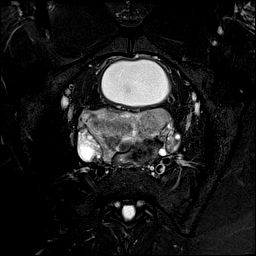
[im 21/21]
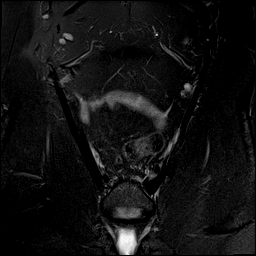

[Series 12: T1 · coronal · 5.0mm · 0.86mm/px · 5 of 21 slices shown]
[im 1/21]
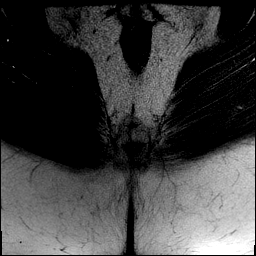
[im 6/21]
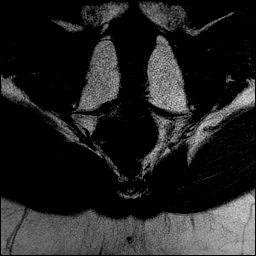
[im 11/21]
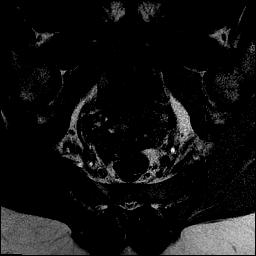
[im 16/21]
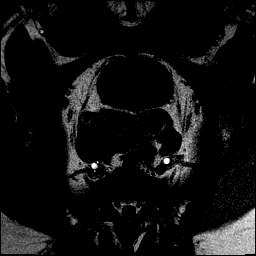
[im 21/21]
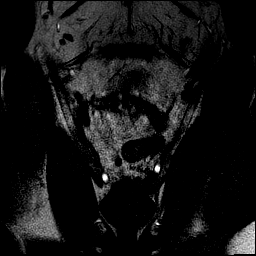

[Series 13: obl ax t1fs · coronal · 5.0mm · 0.86mm/px · 5 of 21 slices shown]
[im 1/21]
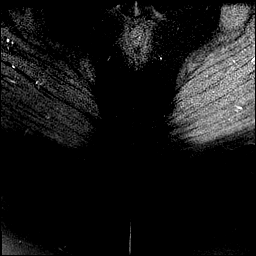
[im 6/21]
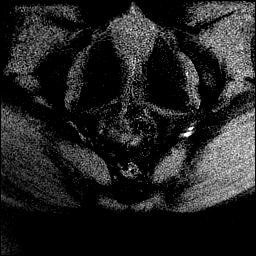
[im 11/21]
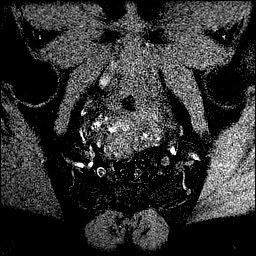
[im 16/21]
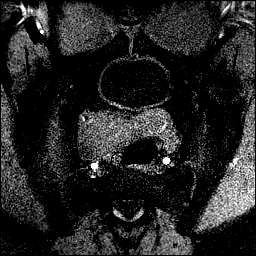
[im 21/21]
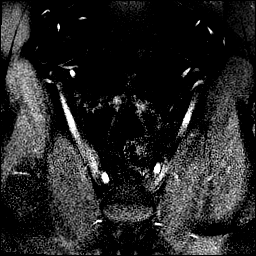

[Series 14: t2_tse cor oblique · axial · 4.0mm · 0.39mm/px · z∈[-72,+43]mm · 6 of 25 slices shown]
[im 1/25]
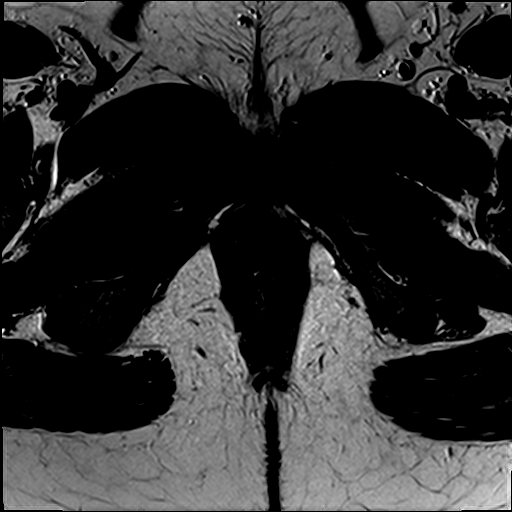
[im 5/25]
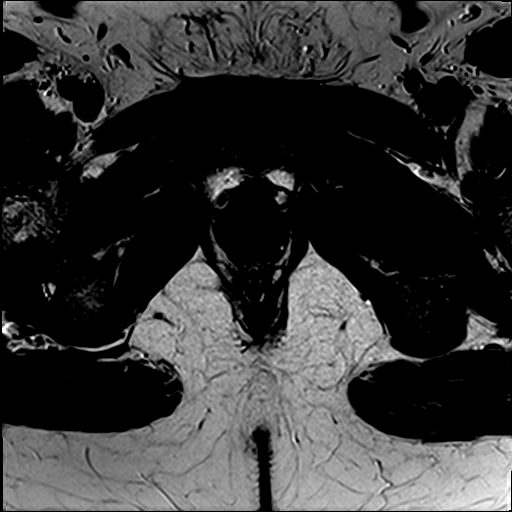
[im 10/25]
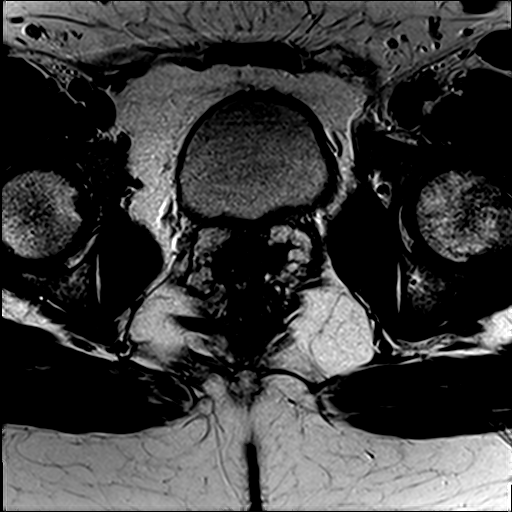
[im 15/25]
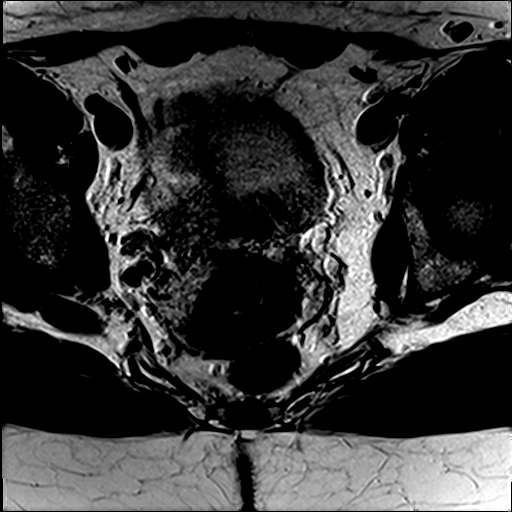
[im 20/25]
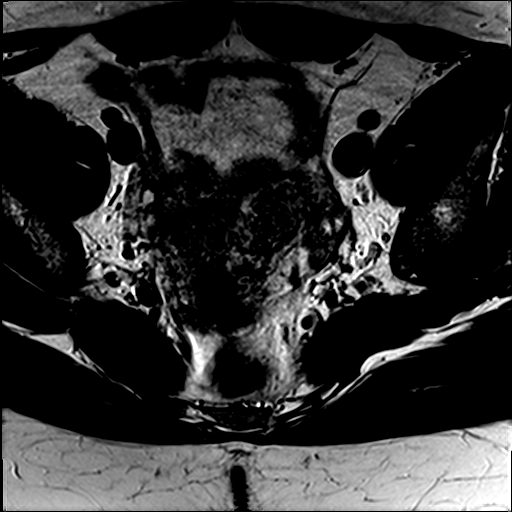
[im 25/25]
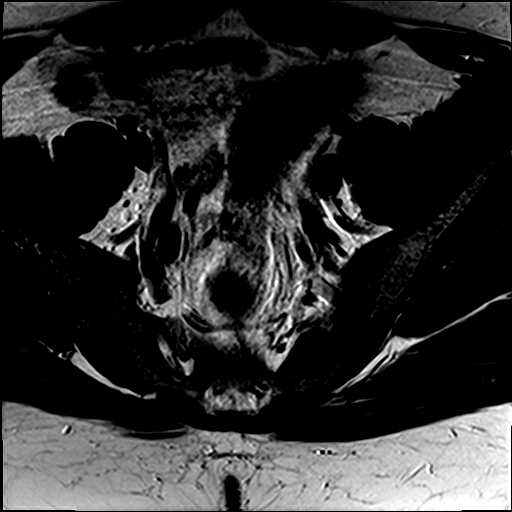

[36 of 48 positions shown; findings below may reference images not displayed]

FINDINGS: Urinary Tract: No distal hydroureter.  Normal urinary bladder.

Bowel: Normal pelvic bowel loops.

Vascular/Lymphatic:  No pelvic aneurysm or sidewall adenopathy.

Reproductive:

Uterus: Normal in appearance. No duplication anomaly. Normal
endometrium.

Right ovary: Dominant follicle within, including on image 4/ series
14.

Left ovary:  Normal in appearance, including on image 5/series 14.

Other: Positioned between the uterus and left ovary, corresponding
to the CT abnormality, is a 2.5 x 2.4 x 3.4 cm homogeneous solid
lesion. This demonstrates relative isointensity to the myometrium.
Supplied by vessels both medially and laterally, including on series
7. Demonstrates moderate post-contrast enhancement. No significant
free fluid.

Musculoskeletal:  No acute osseous abnormality.
IMPRESSION: 1. No evidence of uterine duplication anomaly.
2. The CT abnormality corresponds to a solid left pelvic lesion.
This is indeterminate and could represent either an exophytic
fibroid, including within the broad ligament, or an exophytic left
ovarian lesion such as a fibroma or thecoma. Consider ultrasound
surveillance versus follow-up with pelvic MRI at 3 months.

## 2016-04-29 MED ORDER — GADOBENATE DIMEGLUMINE 529 MG/ML IV SOLN: 20.0000 mL | Freq: Once | INTRAVENOUS | Status: DC | PRN

## 2016-07-06 ENCOUNTER — Other Ambulatory Visit: Payer: Self-pay | Admitting: Obstetrics and Gynecology

## 2016-07-11 NOTE — Patient Instructions (Signed)
Your procedure is scheduled on:  Thursday, July 14, 2016  Enter through the Main Entrance of [facility] at:  6:00 AM  Pick up the phone at the desk and dial [phone].  Call this number if you have problems the morning of surgery: [phone].  Remember: Do NOT eat food or drink after:  Midnight Wednesday  Take these medicines the morning of surgery with a SIP OF WATER:  None  Bring Asthma Inhaler day of surgery  Stop ALL herbal medications at this time  Do NOT smoke the day of surgery.  Do NOT wear jewelry (body piercing), metal hair clips/bobby pins, make-up, artifical eyelashes or nail polish. Do NOT wear lotions, powders, or perfumes.  You may wear deodorant. Do NOT shave for 48 hours prior to surgery. Do NOT bring valuables to the hospital. Contacts, dentures, or bridgework may not be worn into surgery.  Have a responsible adult drive you home and stay with you for 24 hours after your procedure  Bring a copy of your healthcare power of attorney and living will documents.

## 2016-07-12 ENCOUNTER — Encounter (HOSPITAL_COMMUNITY): Payer: Self-pay

## 2016-07-12 ENCOUNTER — Encounter (HOSPITAL_COMMUNITY)
Admission: RE | Admit: 2016-07-12 | Discharge: 2016-07-12 | Disposition: A | Discharge status: Home / Self Care | Payer: 59 | Source: Ambulatory Visit | Attending: Obstetrics and Gynecology | Admitting: Obstetrics and Gynecology

## 2016-07-12 DIAGNOSIS — N920 Excessive and frequent menstruation with regular cycle: Secondary | ICD-10-CM | POA: Diagnosis present

## 2016-07-12 DIAGNOSIS — K219 Gastro-esophageal reflux disease without esophagitis: Secondary | ICD-10-CM | POA: Diagnosis not present

## 2016-07-12 DIAGNOSIS — D252 Subserosal leiomyoma of uterus: Secondary | ICD-10-CM | POA: Diagnosis not present

## 2016-07-12 DIAGNOSIS — F17210 Nicotine dependence, cigarettes, uncomplicated: Secondary | ICD-10-CM | POA: Diagnosis not present

## 2016-07-12 LAB — CBC
HCT: 43 % (ref 36.0–46.0)
Hemoglobin: 14 g/dL (ref 12.0–15.0)
MCH: 29.9 pg (ref 26.0–34.0)
MCHC: 32.6 g/dL (ref 30.0–36.0)
MCV: 91.9 fL (ref 78.0–100.0)
Platelets: 282 10*3/uL (ref 150–400)
RBC: 4.68 MIL/uL (ref 3.87–5.11)
RDW: 12.8 % (ref 11.5–15.5)
WBC: 6.8 10*3/uL (ref 4.0–10.5)

## 2016-07-13 NOTE — Anesthesia Preprocedure Evaluation (Addendum)
Anesthesia Evaluation  Patient identified by MRN, date of birth, ID band Patient awake    Reviewed: Allergy & Precautions, NPO status , Patient's Chart, lab work & pertinent test results  History of Anesthesia Complications (+) PONV and history of anesthetic complications  Airway Mallampati: II  TM Distance: >3 FB Neck ROM: Full    Dental no notable dental hx. (+) Dental Advisory Given   Pulmonary Current Smoker,    Pulmonary exam normal        Cardiovascular negative cardio ROS Normal cardiovascular exam     Neuro/Psych negative neurological ROS  negative psych ROS   GI/Hepatic Neg liver ROS, GERD  ,  Endo/Other  negative endocrine ROS  Renal/GU negative Renal ROS     Musculoskeletal negative musculoskeletal ROS (+)   Abdominal   Peds  Hematology negative hematology ROS (+)   Anesthesia Other Findings Day of surgery medications reviewed with the patient.  Reproductive/Obstetrics                            Anesthesia Physical Anesthesia Plan  ASA: II  Anesthesia Plan: General   Post-op Pain Management:    Induction: Intravenous  PONV Risk Score and Plan: 3 and Ondansetron, Dexamethasone and Scopolamine patch - Pre-op  Airway Management Planned: Oral ETT  Additional Equipment:   Intra-op Plan:   Post-operative Plan: Extubation in OR  Informed Consent: I have reviewed the patients History and Physical, chart, labs and discussed the procedure including the risks, benefits and alternatives for the proposed anesthesia with the patient or authorized representative who has indicated his/her understanding and acceptance.   Dental advisory given  Plan Discussed with: Anesthesiologist and CRNA  Anesthesia Plan Comments:        Anesthesia Quick Evaluation

## 2016-07-14 ENCOUNTER — Encounter (HOSPITAL_COMMUNITY)
Admission: RE | Disposition: A | Discharge status: Home / Self Care | Payer: Self-pay | Source: Ambulatory Visit | Attending: Obstetrics and Gynecology

## 2016-07-14 ENCOUNTER — Ambulatory Visit (HOSPITAL_COMMUNITY): Payer: 59 | Admitting: Anesthesiology

## 2016-07-14 ENCOUNTER — Ambulatory Visit (HOSPITAL_COMMUNITY)
Admission: RE | Admit: 2016-07-14 | Discharge: 2016-07-14 | Disposition: A | Discharge status: Home / Self Care | Payer: 59 | Source: Ambulatory Visit | Attending: Obstetrics and Gynecology | Admitting: Obstetrics and Gynecology

## 2016-07-14 ENCOUNTER — Encounter (HOSPITAL_COMMUNITY): Payer: Self-pay

## 2016-07-14 DIAGNOSIS — N920 Excessive and frequent menstruation with regular cycle: Secondary | ICD-10-CM | POA: Insufficient documentation

## 2016-07-14 DIAGNOSIS — K219 Gastro-esophageal reflux disease without esophagitis: Secondary | ICD-10-CM | POA: Insufficient documentation

## 2016-07-14 DIAGNOSIS — F17210 Nicotine dependence, cigarettes, uncomplicated: Secondary | ICD-10-CM | POA: Insufficient documentation

## 2016-07-14 DIAGNOSIS — D252 Subserosal leiomyoma of uterus: Secondary | ICD-10-CM | POA: Diagnosis not present

## 2016-07-14 HISTORY — DX: Nausea with vomiting, unspecified: R11.2

## 2016-07-14 HISTORY — PX: LAPAROSCOPY: SHX197

## 2016-07-14 HISTORY — DX: Other specified postprocedural states: Z98.890

## 2016-07-14 HISTORY — PX: DILATION AND CURETTAGE OF UTERUS: SHX78

## 2016-07-14 LAB — PREGNANCY, URINE: PREG TEST UR: NEGATIVE

## 2016-07-14 SURGERY — LAPAROSCOPY, DIAGNOSTIC
Anesthesia: General | Site: Vagina

## 2016-07-14 MED ORDER — SCOPOLAMINE 1 MG/3DAYS TD PT72
MEDICATED_PATCH | TRANSDERMAL | Status: AC
  Administered 2016-07-14: 1.5 mg via TRANSDERMAL
  Filled 2016-07-14: qty 1

## 2016-07-14 MED ORDER — SCOPOLAMINE 1 MG/3DAYS TD PT72: 1.0000 | MEDICATED_PATCH | TRANSDERMAL | Status: DC

## 2016-07-14 MED ORDER — FENTANYL CITRATE (PF) 100 MCG/2ML IJ SOLN
INTRAMUSCULAR | Status: AC
  Filled 2016-07-14: qty 2

## 2016-07-14 MED ORDER — ROPIVACAINE HCL 5 MG/ML IJ SOLN
INTRAMUSCULAR | Status: AC
  Filled 2016-07-14: qty 30

## 2016-07-14 MED ORDER — SUGAMMADEX SODIUM 500 MG/5ML IV SOLN
INTRAVENOUS | Status: AC
  Filled 2016-07-14: qty 5

## 2016-07-14 MED ORDER — SODIUM CHLORIDE 0.9 % IJ SOLN
INTRAMUSCULAR | Status: DC | PRN
  Administered 2016-07-14: 10 mL

## 2016-07-14 MED ORDER — ROCURONIUM BROMIDE 10 MG/ML (PF) SYRINGE
PREFILLED_SYRINGE | INTRAVENOUS | Status: DC | PRN
  Administered 2016-07-14: 60 mg via INTRAVENOUS

## 2016-07-14 MED ORDER — ROCURONIUM BROMIDE 100 MG/10ML IV SOLN
INTRAVENOUS | Status: AC
  Filled 2016-07-14: qty 1

## 2016-07-14 MED ORDER — SCOPOLAMINE 1 MG/3DAYS TD PT72
1.0000 | MEDICATED_PATCH | Freq: Once | TRANSDERMAL | Status: DC
  Administered 2016-07-14: 1.5 mg via TRANSDERMAL

## 2016-07-14 MED ORDER — ONDANSETRON HCL 4 MG/2ML IJ SOLN
INTRAMUSCULAR | Status: DC | PRN
  Administered 2016-07-14: 4 mg via INTRAVENOUS

## 2016-07-14 MED ORDER — MIDAZOLAM HCL 5 MG/5ML IJ SOLN
INTRAMUSCULAR | Status: DC | PRN
  Administered 2016-07-14: 2 mg via INTRAVENOUS

## 2016-07-14 MED ORDER — LIDOCAINE HCL (CARDIAC) 20 MG/ML IV SOLN
INTRAVENOUS | Status: AC
  Filled 2016-07-14: qty 5

## 2016-07-14 MED ORDER — SUGAMMADEX SODIUM 200 MG/2ML IV SOLN
INTRAVENOUS | Status: DC | PRN
  Administered 2016-07-14: 500 mg via INTRAVENOUS

## 2016-07-14 MED ORDER — FENTANYL CITRATE (PF) 250 MCG/5ML IJ SOLN
INTRAMUSCULAR | Status: AC
  Filled 2016-07-14: qty 5

## 2016-07-14 MED ORDER — PROMETHAZINE HCL 25 MG/ML IJ SOLN: 6.2500 mg | INTRAMUSCULAR | Status: DC | PRN

## 2016-07-14 MED ORDER — ARTIFICIAL TEARS OPHTHALMIC OINT
TOPICAL_OINTMENT | OPHTHALMIC | Status: AC
  Filled 2016-07-14: qty 3.5

## 2016-07-14 MED ORDER — DEXAMETHASONE SODIUM PHOSPHATE 10 MG/ML IJ SOLN
INTRAMUSCULAR | Status: DC | PRN
  Administered 2016-07-14: 10 mg via INTRAVENOUS

## 2016-07-14 MED ORDER — FENTANYL CITRATE (PF) 250 MCG/5ML IJ SOLN
INTRAMUSCULAR | Status: DC | PRN
  Administered 2016-07-14: 50 ug via INTRAVENOUS
  Administered 2016-07-14: 150 ug via INTRAVENOUS
  Administered 2016-07-14: 50 ug via INTRAVENOUS

## 2016-07-14 MED ORDER — LACTATED RINGERS IV SOLN
INTRAVENOUS | Status: DC
  Administered 2016-07-14 (×2): via INTRAVENOUS

## 2016-07-14 MED ORDER — KETOROLAC TROMETHAMINE 30 MG/ML IJ SOLN
INTRAMUSCULAR | Status: AC
  Filled 2016-07-14: qty 2

## 2016-07-14 MED ORDER — PROPOFOL 10 MG/ML IV BOLUS
INTRAVENOUS | Status: DC | PRN
  Administered 2016-07-14: 200 mg via INTRAVENOUS

## 2016-07-14 MED ORDER — BUPIVACAINE HCL (PF) 0.25 % IJ SOLN
INTRAMUSCULAR | Status: DC | PRN
  Administered 2016-07-14: 3 mL

## 2016-07-14 MED ORDER — DEXAMETHASONE SODIUM PHOSPHATE 10 MG/ML IJ SOLN
INTRAMUSCULAR | Status: AC
  Filled 2016-07-14: qty 1

## 2016-07-14 MED ORDER — MIDAZOLAM HCL 2 MG/2ML IJ SOLN
INTRAMUSCULAR | Status: AC
  Filled 2016-07-14: qty 2

## 2016-07-14 MED ORDER — HYDROMORPHONE HCL 1 MG/ML IJ SOLN
0.2500 mg | INTRAMUSCULAR | Status: DC | PRN
  Administered 2016-07-14: 0.5 mg via INTRAVENOUS

## 2016-07-14 MED ORDER — BUPIVACAINE HCL (PF) 0.25 % IJ SOLN
INTRAMUSCULAR | Status: AC
  Filled 2016-07-14: qty 30

## 2016-07-14 MED ORDER — ONDANSETRON HCL 4 MG/2ML IJ SOLN
INTRAMUSCULAR | Status: AC
Start: 2016-07-14 — End: 2016-07-14
  Filled 2016-07-14: qty 2

## 2016-07-14 MED ORDER — PROPOFOL 10 MG/ML IV BOLUS
INTRAVENOUS | Status: AC
  Filled 2016-07-14: qty 20

## 2016-07-14 MED ORDER — KETOROLAC TROMETHAMINE 30 MG/ML IJ SOLN
INTRAMUSCULAR | Status: DC | PRN
  Administered 2016-07-14: 30 mg via INTRAMUSCULAR
  Administered 2016-07-14: 30 mg via INTRAVENOUS

## 2016-07-14 MED ORDER — LIDOCAINE 2% (20 MG/ML) 5 ML SYRINGE
INTRAMUSCULAR | Status: DC | PRN
  Administered 2016-07-14: 100 mg via INTRAVENOUS

## 2016-07-14 MED ORDER — HYDROMORPHONE HCL 1 MG/ML IJ SOLN
INTRAMUSCULAR | Status: AC
  Filled 2016-07-14: qty 0.5

## 2016-07-14 MED ORDER — LIDOCAINE HCL 1 % IJ SOLN
INTRAMUSCULAR | Status: AC
  Filled 2016-07-14: qty 10

## 2016-07-14 MED ORDER — SODIUM CHLORIDE 0.9 % IJ SOLN
INTRAMUSCULAR | Status: AC
  Filled 2016-07-14: qty 50

## 2016-07-14 SURGICAL SUPPLY — 51 items
APPLICATOR ARISTA FLEXITIP XL (MISCELLANEOUS) IMPLANT
BARRIER ADHS 3X4 INTERCEED (GAUZE/BANDAGES/DRESSINGS) IMPLANT
CATH FOLEY 3WAY  5CC 16FR (CATHETERS) ×1
CATH FOLEY 3WAY 5CC 16FR (CATHETERS) ×3 IMPLANT
CONT PATH 16OZ SNAP LID 3702 (MISCELLANEOUS) IMPLANT
COVER BACK TABLE 60X90IN (DRAPES) ×8 IMPLANT
COVER TIP SHEARS 8 DVNC (MISCELLANEOUS) ×3 IMPLANT
COVER TIP SHEARS 8MM DA VINCI (MISCELLANEOUS) ×1
DECANTER SPIKE VIAL GLASS SM (MISCELLANEOUS) ×12 IMPLANT
DEFOGGER SCOPE WARMER CLEARIFY (MISCELLANEOUS) IMPLANT
DERMABOND ADVANCED (GAUZE/BANDAGES/DRESSINGS) ×1
DERMABOND ADVANCED .7 DNX12 (GAUZE/BANDAGES/DRESSINGS) ×3 IMPLANT
DRSG OPSITE POSTOP 3X4 (GAUZE/BANDAGES/DRESSINGS) ×4 IMPLANT
DRSG TELFA 3X8 NADH (GAUZE/BANDAGES/DRESSINGS) ×4 IMPLANT
DURAPREP 26ML APPLICATOR (WOUND CARE) ×4 IMPLANT
ELECT REM PT RETURN 9FT ADLT (ELECTROSURGICAL) ×4
ELECTRODE REM PT RTRN 9FT ADLT (ELECTROSURGICAL) ×3 IMPLANT
GAUZE VASELINE 3X9 (GAUZE/BANDAGES/DRESSINGS) IMPLANT
GLOVE BIOGEL PI IND STRL 7.0 (GLOVE) ×15 IMPLANT
GLOVE BIOGEL PI INDICATOR 7.0 (GLOVE) ×5
GLOVE ECLIPSE 6.5 STRL STRAW (GLOVE) ×12 IMPLANT
HEMOSTAT ARISTA ABSORB 3G PWDR (MISCELLANEOUS) IMPLANT
KIT ACCESSORY DA VINCI DISP (KITS)
KIT ACCESSORY DVNC DISP (KITS) IMPLANT
LEGGING LITHOTOMY PAIR STRL (DRAPES) IMPLANT
NEEDLE INSUFFLATION 150MM (ENDOMECHANICALS) ×4 IMPLANT
NS IRRIG 1000ML POUR BTL (IV SOLUTION) ×4 IMPLANT
PACK ROBOT WH (CUSTOM PROCEDURE TRAY) ×4 IMPLANT
PACK ROBOTIC GOWN (GOWN DISPOSABLE) ×4 IMPLANT
PACK TRENDGUARD 450 HYBRID PRO (MISCELLANEOUS) ×3 IMPLANT
PACK TRENDGUARD 600 HYBRD PROC (MISCELLANEOUS) IMPLANT
PAD PREP 24X48 CUFFED NSTRL (MISCELLANEOUS) ×4 IMPLANT
PROTECTOR NERVE ULNAR (MISCELLANEOUS) ×8 IMPLANT
SET CYSTO W/LG BORE CLAMP LF (SET/KITS/TRAYS/PACK) IMPLANT
SET IRRIG TUBING LAPAROSCOPIC (IRRIGATION / IRRIGATOR) ×4 IMPLANT
SET TRI-LUMEN FLTR TB AIRSEAL (TUBING) ×4 IMPLANT
SUT VICRYL 0 UR6 27IN ABS (SUTURE) ×4 IMPLANT
SUT VICRYL 4-0 PS2 18IN ABS (SUTURE) ×8 IMPLANT
SYR 50ML LL SCALE MARK (SYRINGE) ×4 IMPLANT
TIP UTERINE 5.1X6CM LAV DISP (MISCELLANEOUS) IMPLANT
TIP UTERINE 6.7X10CM GRN DISP (MISCELLANEOUS) IMPLANT
TIP UTERINE 6.7X6CM WHT DISP (MISCELLANEOUS) IMPLANT
TIP UTERINE 6.7X8CM BLUE DISP (MISCELLANEOUS) ×4 IMPLANT
TOWEL OR 17X24 6PK STRL BLUE (TOWEL DISPOSABLE) ×12 IMPLANT
TRENDGUARD 450 HYBRID PRO PACK (MISCELLANEOUS) ×4
TRENDGUARD 600 HYBRID PROC PK (MISCELLANEOUS)
TROCAR DISP BLADELESS 8 DVNC (TROCAR) ×3 IMPLANT
TROCAR DISP BLADELESS 8MM (TROCAR) ×1
TROCAR PORT AIRSEAL 8X120 (TROCAR) ×4 IMPLANT
TROCAR XCEL NON-BLD 5MMX100MML (ENDOMECHANICALS) ×4 IMPLANT
TROCAR Z-THREAD 12X150 (TROCAR) ×4 IMPLANT

## 2016-07-14 NOTE — Brief Op Note (Signed)
07/14/2016  8:36 AM  PATIENT:  Cheryl Friedman  36 y.o. female  PRE-OPERATIVE DIAGNOSIS:  Left Pelvic Mass, menorrhagia, RLQ pain  POST-OPERATIVE DIAGNOSIS:  subserosal fibroid, menorrhagia with regular cycle, RLQ pain  PROCEDURE:  Diagnostic laparoscopy, dilation and curettage  SURGEON:  Surgeon(s) and Role:    * Fallou Hulbert, Alanda Slim, MD - Primary  PHYSICIAN ASSISTANT:   ASSISTANTS: none   ANESTHESIA:   general Findings: nl ovaries, nl appendix, nl liver edge, no endometriosis noted post and anterior cul de sac, left cornual subserosal fibroid, post SS fibroid, nl tubes EBL:  Total I/O In: 1000 [I.V.:1000] Out: 110 [Urine:100; Blood:10]  BLOOD ADMINISTERED:none  DRAINS: none   LOCAL MEDICATIONS USED:  MARCAINE     SPECIMEN:  Source of Specimen:  [facility]  DISPOSITION OF SPECIMEN:  PATHOLOGY  COUNTS:  YES  TOURNIQUET:  * No tourniquets in log *  DICTATION: .Other Dictation: Dictation Number [id number]  PLAN OF CARE: Discharge to home after PACU  PATIENT DISPOSITION:  PACU - hemodynamically stable.   Delay start of Pharmacological VTE agent (>24hrs) due to surgical blood loss or risk of bleeding: no

## 2016-07-14 NOTE — Transfer of Care (Signed)
Immediate Anesthesia Transfer of Care Note  Patient: Cheryl Friedman  Procedure(s) Performed: Procedure(s): LAPAROSCOPY DIAGNOSTIC (N/A) DILATATION AND CURETTAGE (N/A)  Patient Location: PACU  Anesthesia Type:General  Level of Consciousness:  sedated, patient cooperative and responds to stimulation  Airway & Oxygen Therapy:Patient Spontanous Breathing and Patient connected to face mask oxgen  Post-op Assessment:  Report given to PACU RN and Post -op Vital signs reviewed and stable  Post vital signs:  Reviewed and stable  Last Vitals:  Vitals:   07/14/16 0608  BP: 122/85  Pulse: 76  Resp: 18  Temp: 08.6 C    Complications: No apparent anesthesia complications

## 2016-07-14 NOTE — Anesthesia Procedure Notes (Signed)
Procedure Name: Intubation Date/Time: 07/14/2016 7:28 AM Performed by: Talbot Grumbling Pre-anesthesia Checklist: Patient identified, Emergency Drugs available, Suction available and Patient being monitored Patient Re-evaluated:Patient Re-evaluated prior to inductionOxygen Delivery Method: Circle system utilized Preoxygenation: Pre-oxygenation with 100% oxygen Intubation Type: IV induction Ventilation: Mask ventilation without difficulty Laryngoscope Size: Mac and 3 Grade View: Grade I Tube type: Oral Number of attempts: 1 Airway Equipment and Method: Stylet Placement Confirmation: ETT inserted through vocal cords under direct vision,  positive ETCO2 and breath sounds checked- equal and bilateral Secured at: 22 cm Tube secured with: Tape Dental Injury: Teeth and Oropharynx as per pre-operative assessment

## 2016-07-14 NOTE — Anesthesia Postprocedure Evaluation (Signed)
Anesthesia Post Note  Patient: Cheryl Friedman  Procedure(s) Performed: Procedure(s) (LRB): LAPAROSCOPY DIAGNOSTIC (N/A) DILATATION AND CURETTAGE (N/A)     Patient location during evaluation: PACU Anesthesia Type: General Level of consciousness: sedated Pain management: pain level controlled Vital Signs Assessment: post-procedure vital signs reviewed and stable Respiratory status: spontaneous breathing and respiratory function stable Cardiovascular status: stable Anesthetic complications: no    Last Vitals:  Vitals:   07/14/16 0930 07/14/16 0945  BP: 133/82 120/84  Pulse: 85 64  Resp: 15 14  Temp:      Last Pain:  Vitals:   07/14/16 0945  TempSrc:   PainSc: 4    Pain Goal: Patients Stated Pain Goal: 5 (07/14/16 9407)               Duane Boston DANIEL

## 2016-07-15 ENCOUNTER — Encounter (HOSPITAL_COMMUNITY): Payer: Self-pay | Admitting: Obstetrics and Gynecology

## 2016-07-15 NOTE — Op Note (Signed)
NAMEWESLEY, Cheryl Friedman NO.:  [id number]  MEDICAL RECORD NO.:  [id number]  LOCATION:  PERIO                         FACILITY:  [facility]  PHYSICIAN:  Servando Salina, M.D.DATE OF BIRTH:  1980/11/06  DATE OF PROCEDURE:  07/14/2016 DATE OF DISCHARGE:                              OPERATIVE REPORT   PREOPERATIVE DIAGNOSES: 1. Left adnexal mass. 2. Menorrhagia with regular cycle. 3. RLQ pain  PROCEDURES: 1. Diagnostic laparoscopy, Dilation and curettage.  POSTOPERATIVE DIAGNOSES: 1. Subserosal fibroid. 2. Menorrhagia with regular cycle. 3. RLQ pain  ANESTHESIA:  General.  SURGEON:  Servando Salina, M.D.  ASSISTANT:  Artelia Laroche, CNM.  DESCRIPTION OF PROCEDURE:  Under adequate general anesthesia, the patient was placed in a dorsal lithotomy position.  She was sterilely prepped and draped in usual fashion and indwelling Foley catheter was sterilely placed.  Bivalve speculum was placed in the vagina.  Single- tooth tenaculum was placed on the anterior lip of the cervix.  The cervix was then dilated and gently curetted.  The uterus sounded to a 9 cm.  A #8 uterine manipulator without the RUMI cup was inserted and the bivalve speculum was removed.  Attention was turned to the abdomen. 0.25% Marcaine was injected supraumbilically and a vertical supraumbilical incision was made.  Veress needle was introduced and tested with sterile water.  The carbon dioxide was insufflated, opening pressure of 8 was noted.  Once 2.5 L of CO2 was insufflated, the Veress needle was then removed.  A 12 mm disposable trocar was introduced into the abdomen without incident and the robotic camera port was then inserted.  Inspection revealed entry into the abdomen without incident. The upper abdomen showed normal liver edge.  The patient was placed in Trendelenburg position.  Due to the bowels, the uterus was not easily seen.  Uterine manipulator was then used and the top of the  uterus was noted.  Supraumbilical incision was then made and a 5 mm port was placed under direct visualization.  With manipulation of the uterus, it was then noted that the mass of concern was a left subserosal fibroid, extending off the cornual area with the left fallopian tube noted posteriorly to the mass, round ligament anteriorly.  The left tube and ovary were otherwise normal as were the right tube and ovary.  Appendix was elongated, but normal.  No evidence of endometriosis in the posterior cul-de-sac.  Anteriorly, there were no lesions either.  The procedure was therefore completed by removing the instruments, deflating the abdomen, removing the instruments from the vagina.  SPECIMENS:  Endometrial curetting sent to Pathology.  ESTIMATED BLOOD LOSS:  10 mL.  COMPLICATIONS:  None.  DISPOSITION/CONDITION:  The patient tolerated the procedure well, was transferred to the recovery room in stable condition.     Servando Salina, M.D.     [place]/MEDQ  D:  07/14/2016  T:  07/15/2016  Job:  [id number]

## 2017-04-28 DIAGNOSIS — N760 Acute vaginitis: Secondary | ICD-10-CM | POA: Diagnosis not present

## 2017-04-28 DIAGNOSIS — Z1159 Encounter for screening for other viral diseases: Secondary | ICD-10-CM | POA: Diagnosis not present

## 2017-04-28 DIAGNOSIS — Z118 Encounter for screening for other infectious and parasitic diseases: Secondary | ICD-10-CM | POA: Diagnosis not present

## 2017-06-07 DIAGNOSIS — Z01419 Encounter for gynecological examination (general) (routine) without abnormal findings: Secondary | ICD-10-CM | POA: Diagnosis not present

## 2017-06-07 DIAGNOSIS — Z6836 Body mass index (BMI) 36.0-36.9, adult: Secondary | ICD-10-CM | POA: Diagnosis not present

## 2017-08-17 ENCOUNTER — Encounter (INDEPENDENT_AMBULATORY_CARE_PROVIDER_SITE_OTHER): Payer: Self-pay

## 2017-08-28 ENCOUNTER — Ambulatory Visit (INDEPENDENT_AMBULATORY_CARE_PROVIDER_SITE_OTHER): Payer: 59 | Admitting: Family Medicine

## 2017-08-28 ENCOUNTER — Encounter (INDEPENDENT_AMBULATORY_CARE_PROVIDER_SITE_OTHER): Payer: Self-pay | Admitting: Family Medicine

## 2017-08-28 VITALS — BP 103/69 | HR 74 | Temp 98.1°F | Ht 63.0 in | Wt 206.0 lb

## 2017-08-28 DIAGNOSIS — Z1331 Encounter for screening for depression: Secondary | ICD-10-CM

## 2017-08-28 DIAGNOSIS — Z9189 Other specified personal risk factors, not elsewhere classified: Secondary | ICD-10-CM | POA: Diagnosis not present

## 2017-08-28 DIAGNOSIS — R0602 Shortness of breath: Secondary | ICD-10-CM

## 2017-08-28 DIAGNOSIS — R5383 Other fatigue: Secondary | ICD-10-CM

## 2017-08-28 DIAGNOSIS — Z0289 Encounter for other administrative examinations: Secondary | ICD-10-CM

## 2017-08-28 DIAGNOSIS — Z6836 Body mass index (BMI) 36.0-36.9, adult: Secondary | ICD-10-CM

## 2017-08-29 LAB — LIPID PANEL WITH LDL/HDL RATIO
Cholesterol, Total: 150 mg/dL (ref 100–199)
HDL: 49 mg/dL (ref >39)
LDL Calculated: 88 mg/dL (ref 0–99)
LDl/HDL Ratio: 1.8 ratio (ref 0.0–3.2)
Triglycerides: 63 mg/dL (ref 0–149)
VLDL Cholesterol Cal: 13 mg/dL (ref 5–40)

## 2017-08-29 LAB — COMPREHENSIVE METABOLIC PANEL
ALT: 12 IU/L (ref 0–32)
AST: 16 IU/L (ref 0–40)
Albumin/Globulin Ratio: 1.5 (ref 1.2–2.2)
Albumin: 4.3 g/dL (ref 3.5–5.5)
Alkaline Phosphatase: 65 IU/L (ref 39–117)
BILIRUBIN TOTAL: 0.7 mg/dL (ref 0.0–1.2)
BUN/Creatinine Ratio: 11 (ref 9–23)
BUN: 9 mg/dL (ref 6–20)
CHLORIDE: 103 mmol/L (ref 96–106)
CO2: 21 mmol/L (ref 20–29)
Calcium: 9.4 mg/dL (ref 8.7–10.2)
Creatinine, Ser: 0.85 mg/dL (ref 0.57–1.00)
GFR calc Af Amer: 102 mL/min/{1.73_m2} (ref >59)
GFR calc non Af Amer: 88 mL/min/{1.73_m2} (ref >59)
GLUCOSE: 70 mg/dL (ref 65–99)
Globulin, Total: 2.8 g/dL (ref 1.5–4.5)
Potassium: 3.9 mmol/L (ref 3.5–5.2)
Sodium: 138 mmol/L (ref 134–144)
Total Protein: 7.1 g/dL (ref 6.0–8.5)

## 2017-08-29 LAB — CBC WITH DIFFERENTIAL
BASOS ABS: 0 10*3/uL (ref 0.0–0.2)
Basos: 1 %
EOS (ABSOLUTE): 0.1 10*3/uL (ref 0.0–0.4)
Eos: 2 %
Hematocrit: 40.8 % (ref 34.0–46.6)
Hemoglobin: 13.9 g/dL (ref 11.1–15.9)
IMMATURE GRANS (ABS): 0 10*3/uL (ref 0.0–0.1)
Immature Granulocytes: 0 %
LYMPHS ABS: 1.4 10*3/uL (ref 0.7–3.1)
LYMPHS: 24 %
MCH: 29.6 pg (ref 26.6–33.0)
MCHC: 34.1 g/dL (ref 31.5–35.7)
MCV: 87 fL (ref 79–97)
Monocytes Absolute: 0.4 10*3/uL (ref 0.1–0.9)
Monocytes: 7 %
NEUTROS ABS: 4 10*3/uL (ref 1.4–7.0)
NEUTROS PCT: 66 %
RBC: 4.69 x10E6/uL (ref 3.77–5.28)
RDW: 12.8 % (ref 12.3–15.4)
WBC: 6 10*3/uL (ref 3.4–10.8)

## 2017-08-29 LAB — FOLATE: Folate: 6.2 ng/mL (ref >3.0)

## 2017-08-29 LAB — INSULIN, RANDOM: INSULIN: 8 u[IU]/mL (ref 2.6–24.9)

## 2017-08-29 LAB — T3: T3 TOTAL: 142 ng/dL (ref 71–180)

## 2017-08-29 LAB — VITAMIN D 25 HYDROXY (VIT D DEFICIENCY, FRACTURES): Vit D, 25-Hydroxy: 8.5 ng/mL — ABNORMAL LOW (ref 30.0–100.0)

## 2017-08-29 LAB — T4, FREE: FREE T4: 1.27 ng/dL (ref 0.82–1.77)

## 2017-08-29 LAB — HEMOGLOBIN A1C
ESTIMATED AVERAGE GLUCOSE: 108 mg/dL
HEMOGLOBIN A1C: 5.4 % (ref 4.8–5.6)

## 2017-08-29 LAB — TSH: TSH: 0.898 u[IU]/mL (ref 0.450–4.500)

## 2017-08-29 LAB — VITAMIN B12: VITAMIN B 12: 473 pg/mL (ref 232–1245)

## 2017-08-29 NOTE — Progress Notes (Signed)
Office: [phone]  /  Fax: [phone]   Dear Dr. Garwin Brothers,   Thank you for referring Cheryl Friedman to our clinic. The following note includes my evaluation and treatment recommendations.  HPI:   Chief Complaint: OBESITY    Cheryl Friedman has been referred by Servando Salina, MD for consultation regarding her obesity and obesity related comorbidities.    Cheryl Friedman (MR# [id number]) is a 38 y.o. female who presents on 08/28/2017 for obesity evaluation and treatment. Current BMI is Body mass index is 36.49 kg/m.Marland Kitchen Cheryl Friedman has been struggling with her weight for many years and has been unsuccessful in either losing weight, maintaining weight loss, or reaching her healthy weight goal.     Cheryl Friedman attended our information session and states she is currently in the action stage of change and ready to dedicate time achieving and maintaining a healthier weight. Cheryl Friedman is interested in becoming our patient and working on intensive lifestyle modifications including (but not limited to) diet, exercise and weight loss.    Cheryl Friedman states her family eats meals together she thinks her family will eat healthier with  her her desired weight loss is 66 lbs she started gaining weight in 2018 her heaviest weight ever was 210 lbs she is a picky eater and doesn't like to eat healthier foods  she has significant food cravings issues  she snacks frequently in the evenings she skips meals frequently she is frequently drinking liquids with calories she frequently makes poor food choices   Cheryl Friedman feels her energy is lower than it should be. This has worsened with weight gain and has not worsened recently. Cheryl Friedman admits to daytime somnolence and  admits to waking up still tired. Patient is at risk for obstructive sleep apnea. Cheryl Friedman has a history of symptoms of daytime Cheryl and morning headache. Patient generally gets 6 hours of sleep per night, and states they generally have  generally restful sleep. Snoring is present. Apneic episodes are not present. Epworth Sleepiness Score is 8.  Dyspnea on exertion Cheryl Friedman notes increasing shortness of breath with exercising and seems to be worsening over time with weight gain. She notes getting out of breath sooner with activity than she used to. This has not gotten worse recently. Cheryl Friedman denies orthopnea.  Depression Screen Cheryl Friedman's Food and Mood (modified PHQ-9) score was  Depression screen PHQ 2/9 08/28/2017  Decreased Interest 1  Down, Depressed, Hopeless 0  PHQ - 2 Score 1  Altered sleeping 0  Tired, decreased energy 3  Change in appetite 0  Feeling bad or failure about yourself  0  Trouble concentrating 0  Moving slowly or fidgety/restless 0  Suicidal thoughts 0  PHQ-9 Score 4  Difficult doing work/chores Somewhat difficult   At risk for cardiovascular disease Cheryl Friedman is at a higher than average risk for cardiovascular disease due to obesity. She currently denies any chest pain.  ALLERGIES: Allergies  Allergen Reactions  . Coconut Oil Swelling    Throat and eye swelling  . Gadolinium Derivatives     Pt got nauseated and vomitted.  Inject slowly next time.  . Latex Swelling    gloves    MEDICATIONS: Current Outpatient Medications on File Prior to Visit  Medication Sig Dispense Refill  . doxycycline (VIBRA-TABS) 100 MG tablet Take 100 mg by mouth 2 (two) times daily.    Marland Kitchen ibuprofen (ADVIL,MOTRIN) 800 MG tablet Take 800 mg by mouth every 6 (six) hours as needed for mild pain.    Marland Kitchen norethindrone-ethinyl estradiol (FEMHRT 1/5)  1-5 MG-MCG TABS tablet Take 1 tablet by mouth daily. (Patient taking differently: Take 1 tablet by mouth at bedtime. ) 28 tablet 0  . [DISCONTINUED] omeprazole (PRILOSEC) 20 MG capsule Take 1 capsule (20 mg total) by mouth daily. (Patient not taking: Reported on 12/25/2013) 30 capsule 0  . [DISCONTINUED] promethazine (PHENERGAN) 12.5 MG suppository Place 1 suppository (12.5 mg  total) rectally every 6 (six) hours as needed for nausea. (Patient not taking: Reported on 12/25/2013) 12 each 0   No current facility-administered medications on file prior to visit.     PAST MEDICAL HISTORY: Past Medical History:  Diagnosis Date  . Back pain   . Dyspnea   . Endometriosis   . GERD (gastroesophageal reflux disease)   . Joint pain   . Leg pain   . PONV (postoperative nausea and vomiting)     PAST SURGICAL HISTORY: Past Surgical History:  Procedure Laterality Date  . COLPOSCOPY  08/2012  . DILATION AND CURETTAGE OF UTERUS N/A 07/14/2016   Procedure: DILATATION AND CURETTAGE;  Surgeon: Servando Salina, MD;  Location: [facility];  Service: Gynecology;  Laterality: N/A;  . LAPAROSCOPY N/A 07/14/2016   Procedure: LAPAROSCOPY DIAGNOSTIC;  Surgeon: Servando Salina, MD;  Location: [facility];  Service: Gynecology;  Laterality: N/A;  . OVARIAN CYST SURGERY  2003    SOCIAL HISTORY: Social History   Tobacco Use  . Smoking status: Current Some Day Smoker    Packs/day: 0.10    Types: Cigars  . Smokeless tobacco: Never Used  . Tobacco comment: SOCIALLY SMOKES BLACK AND MILD  Substance Use Topics  . Alcohol use: No  . Drug use: No    FAMILY HISTORY: Family History  Problem Relation Age of Onset  . Hypertension Maternal Aunt   . Hypertension Maternal Grandmother   . Hypertension Maternal Aunt   . Hyperlipidemia Mother   . Hypertension Mother   . Cancer Mother     ROS: Review of Systems  Constitutional: Positive for malaise/Cheryl. Negative for weight loss.  HENT: Positive for sinus pain.   Respiratory: Positive for shortness of breath (with exertion).   Cardiovascular: Negative for chest pain and orthopnea.  Gastrointestinal:       + Rectal bleeding  Musculoskeletal: Positive for back pain.       + Muscle or joint pain + Muscle stiffness  Neurological: Positive for weakness and headaches.    PHYSICAL EXAM: Blood pressure 103/69, pulse 74, temperature  98.1 F (36.7 C), temperature source Oral, height 5\' 3"  (1.6 m), weight 206 lb (93.4 kg), last menstrual period 08/20/2017, SpO2 100 %. Body mass index is 36.49 kg/m. Physical Exam  Constitutional: She is oriented to person, place, and time. She appears well-developed and well-nourished.  HENT:  Head: Normocephalic and atraumatic.  Nose: Nose normal.  Eyes: EOM are normal. No scleral icterus.  Neck: Normal range of motion. Neck supple. No thyromegaly present.  Cardiovascular: Normal rate and regular rhythm.  Pulmonary/Chest: Effort normal. No respiratory distress.  Abdominal: Soft. There is no tenderness.  + Obesity  Musculoskeletal:  Range of Motion normal in all 4 extremities Trace edema noted in bilateral lower extremities  Neurological: She is alert and oriented to person, place, and time. Coordination normal.  Skin: Skin is warm and dry.  Psychiatric: She has a normal mood and affect. Her behavior is normal.  Vitals reviewed.   RECENT LABS AND TESTS: BMET    Component Value Date/Time   NA 138 08/28/2017 1024   K 3.9 08/28/2017  1024   CL 103 08/28/2017 1024   CO2 21 08/28/2017 1024   GLUCOSE 70 08/28/2017 1024   GLUCOSE 93 01/19/2014 1930   BUN 9 08/28/2017 1024   CREATININE 0.85 08/28/2017 1024   CALCIUM 9.4 08/28/2017 1024   GFRNONAA 88 08/28/2017 1024   GFRAA 102 08/28/2017 1024   Lab Results  Component Value Date   HGBA1C 5.4 08/28/2017   Lab Results  Component Value Date   INSULIN 8.0 08/28/2017   CBC    Component Value Date/Time   WBC 6.0 08/28/2017 1024   WBC 6.8 07/12/2016 1005   RBC 4.69 08/28/2017 1024   RBC 4.68 07/12/2016 1005   HGB 13.9 08/28/2017 1024   HCT 40.8 08/28/2017 1024   PLT 282 07/12/2016 1005   MCV 87 08/28/2017 1024   MCH 29.6 08/28/2017 1024   MCH 29.9 07/12/2016 1005   MCHC 34.1 08/28/2017 1024   MCHC 32.6 07/12/2016 1005   RDW 12.8 08/28/2017 1024   LYMPHSABS 1.4 08/28/2017 1024   MONOABS 0.6 01/19/2014 1930   EOSABS  0.1 08/28/2017 1024   BASOSABS 0.0 08/28/2017 1024   Iron/TIBC/Ferritin/ %Sat No results found for: IRON, TIBC, FERRITIN, IRONPCTSAT Lipid Panel     Component Value Date/Time   CHOL 150 08/28/2017 1024   TRIG 63 08/28/2017 1024   HDL 49 08/28/2017 1024   LDLCALC 88 08/28/2017 1024   Hepatic Function Panel     Component Value Date/Time   PROT 7.1 08/28/2017 1024   ALBUMIN 4.3 08/28/2017 1024   AST 16 08/28/2017 1024   ALT 12 08/28/2017 1024   ALKPHOS 65 08/28/2017 1024   BILITOT 0.7 08/28/2017 1024   BILIDIR 0.1 12/09/2011 2320   IBILI 0.5 12/09/2011 2320      Component Value Date/Time   TSH 0.898 08/28/2017 1024    ECG  shows NSR with a rate of 80 BPM INDIRECT CALORIMETER done today shows a VO2 of 179 and a Cheryl Friedman of 1247.  Her calculated basal metabolic rate is 0623 thus her basal metabolic rate is worse than expected.    ASSESSMENT AND PLAN: Other Cheryl - Plan: EKG 12-Lead, Vitamin B12, CBC With Differential, Comprehensive metabolic panel, Folate, Hemoglobin A1c, Insulin, random, Lipid Panel With LDL/HDL Ratio, T3, T4, free, TSH, VITAMIN D 25 Hydroxy (Vit-D Deficiency, Fractures)  Shortness of breath on exertion - Plan: CBC With Differential  Depression screening  At risk for heart disease  Class 2 severe obesity with serious comorbidity and body mass index (BMI) of 36.0 to 36.9 in adult, unspecified obesity type (Cheryl Friedman)  PLAN:  Cheryl Cheryl Friedman was informed that her Cheryl may be related to obesity, depression or many other causes. Labs will be ordered, and in the meanwhile Dorella has agreed to work on diet, exercise and weight loss to help with Cheryl. Proper sleep hygiene was discussed including the need for 7-8 hours of quality sleep each night. A sleep study was not ordered based on symptoms and Epworth score.  Dyspnea on exertion Cheryl Friedman's shortness of breath appears to be obesity related and exercise induced. She has agreed to work on weight loss and  gradually increase exercise to treat her exercise induced shortness of breath. If Cheryl Friedman follows our instructions and loses weight without improvement of her shortness of breath, we will plan to refer to pulmonology. We will monitor this condition regularly. Cheryl Friedman agrees to this plan.  Depression Screen Cheryl Friedman had a negative depression screening. Depression is commonly associated with obesity and often results in  emotional eating behaviors. We will monitor this closely and work on CBT to help improve the non-hunger eating patterns. Referral to Psychology may be required if no improvement is seen as she continues in our clinic.  Cardiovascular risk counselling Cheryl Friedman was given extended (15 minutes) coronary artery disease prevention counseling today. She is 37 y.o. female and has risk factors for heart disease including obesity. We discussed intensive lifestyle modifications today with an emphasis on specific weight loss instructions and strategies. Pt was also informed of the importance of increasing exercise and decreasing saturated fats to help prevent heart disease.  Obesity Cheryl Friedman is currently in the action stage of change and her goal is to continue with weight loss efforts. I recommend Cheryl Friedman begin the structured treatment plan as follows:  She has agreed to follow the Category 1 plan Cheryl Friedman has been instructed to eventually work up to a goal of 150 minutes of combined cardio and strengthening exercise per week for weight loss and overall health benefits. We discussed the following Behavioral Modification Strategies today: increasing lean protein intake, decreasing simple carbohydrates  and work on meal planning and easy cooking plans   She was informed of the importance of frequent follow up visits to maximize her success with intensive lifestyle modifications for her multiple health conditions. She was informed we would discuss her lab results at her next visit unless there is  a critical issue that needs to be addressed sooner. Mumtaz agreed to keep her next visit at the agreed upon time to discuss these results.    OBESITY BEHAVIORAL INTERVENTION VISIT  Today's visit was # 1 out of 22.  Starting weight: 206 lbs Starting date: 08/28/17 Today's weight : 206 lbs  Today's date: 08/28/2017 Total lbs lost to date: 0 (Patients must lose 7 lbs in the first 6 months to continue with counseling)   ASK: We discussed the diagnosis of obesity with Lashawnta Haynesworth today and Oval agreed to give [place] permission to discuss obesity behavioral modification therapy today.  ASSESS: Stephanne has the diagnosis of obesity and her BMI today is 36.5 Casidee is in the action stage of change   ADVISE: Sanela was educated on the multiple health risks of obesity as well as the benefit of weight loss to improve her health. She was advised of the need for long term treatment and the importance of lifestyle modifications.  AGREE: Multiple dietary modification options and treatment options were discussed and  Yoshino agreed to the above obesity treatment plan.   I, Trixie Dredge, am acting as transcriptionist for Dennard Nip, MD  I have reviewed the above documentation for accuracy and completeness, and I agree with the above. -Dennard Nip, MD

## 2017-09-11 ENCOUNTER — Ambulatory Visit (INDEPENDENT_AMBULATORY_CARE_PROVIDER_SITE_OTHER): Payer: 59 | Admitting: Family Medicine

## 2017-09-11 ENCOUNTER — Ambulatory Visit (INDEPENDENT_AMBULATORY_CARE_PROVIDER_SITE_OTHER): Payer: Self-pay | Admitting: Physician Assistant

## 2017-09-11 VITALS — BP 109/72 | HR 76 | Temp 98.3°F | Ht 63.0 in | Wt 205.0 lb

## 2017-09-11 DIAGNOSIS — E8881 Metabolic syndrome: Secondary | ICD-10-CM | POA: Diagnosis not present

## 2017-09-11 DIAGNOSIS — Z9189 Other specified personal risk factors, not elsewhere classified: Secondary | ICD-10-CM | POA: Diagnosis not present

## 2017-09-11 DIAGNOSIS — Z6836 Body mass index (BMI) 36.0-36.9, adult: Secondary | ICD-10-CM

## 2017-09-11 DIAGNOSIS — E559 Vitamin D deficiency, unspecified: Secondary | ICD-10-CM | POA: Diagnosis not present

## 2017-09-11 MED ORDER — VITAMIN D (ERGOCALCIFEROL) 1.25 MG (50000 UNIT) PO CAPS: 50000.0000 [IU] | ORAL_CAPSULE | ORAL | 0 refills | Status: DC

## 2017-09-11 NOTE — Progress Notes (Signed)
Office: [phone]  /  Fax: [phone]   HPI:   Chief Complaint: OBESITY Cheryl Friedman is here to discuss her progress with her obesity treatment plan. She is on the Category 1 plan and is following her eating plan approximately 60-70 % of the time. She states she is walking for 30-40 minutes 2-3 times per week. Cheryl Friedman was able to lose a little weight but she deviated from her plan significantly. She tried to pick healthy options but ended up increasing carbohydrates and decreasing lean protein.  Her weight is 205 lb (93 kg) today and has had a weight loss of 1 pound over a period of 2 weeks since her last visit. She has lost 1 lb since starting treatment with [place].  Vitamin D Deficiency Cheryl Friedman has a new diagnosis of vitamin D deficiency. Cheryl Friedman's Vit D level is at 8.5, she notes fatigue and denies nausea, vomiting or muscle weakness.  Insulin Resistance Cheryl Friedman has a new diagnosis of insulin resistance based on her elevated fasting insulin level >5. Her A1c and glucose levels are normal, with slightly elevated fasting insulin. Which is likely resulting in frequent hypoglycemia and polyphagia. Although Cheryl Friedman's blood glucose readings are still under good control, insulin resistance puts her at greater risk of metabolic syndrome and diabetes. She is not taking metformin currently and continues to work on diet and exercise to decrease risk of diabetes.  At risk for diabetes Cheryl Friedman is at higher than average risk for developing diabetes due to her obesity and insulin resistance. She currently denies polyuria or polydipsia.  ALLERGIES: Allergies  Allergen Reactions  . Coconut Oil Swelling    Throat and eye swelling  . Gadolinium Derivatives     Pt got nauseated and vomitted.  Inject slowly next time.  . Latex Swelling    gloves    MEDICATIONS: Current Outpatient Medications on File Prior to Visit  Medication Sig Dispense Refill  . doxycycline (VIBRA-TABS) 100 MG tablet Take  100 mg by mouth 2 (two) times daily.    Marland Kitchen ibuprofen (ADVIL,MOTRIN) 800 MG tablet Take 800 mg by mouth every 6 (six) hours as needed for mild pain.    Marland Kitchen norethindrone-ethinyl estradiol (FEMHRT 1/5) 1-5 MG-MCG TABS tablet Take 1 tablet by mouth daily. (Patient taking differently: Take 1 tablet by mouth at bedtime. ) 28 tablet 0  . [DISCONTINUED] omeprazole (PRILOSEC) 20 MG capsule Take 1 capsule (20 mg total) by mouth daily. (Patient not taking: Reported on 12/25/2013) 30 capsule 0  . [DISCONTINUED] promethazine (PHENERGAN) 12.5 MG suppository Place 1 suppository (12.5 mg total) rectally every 6 (six) hours as needed for nausea. (Patient not taking: Reported on 12/25/2013) 12 each 0   No current facility-administered medications on file prior to visit.     PAST MEDICAL HISTORY: Past Medical History:  Diagnosis Date  . Back pain   . Dyspnea   . Endometriosis   . GERD (gastroesophageal reflux disease)   . Joint pain   . Leg pain   . PONV (postoperative nausea and vomiting)     PAST SURGICAL HISTORY: Past Surgical History:  Procedure Laterality Date  . COLPOSCOPY  08/2012  . DILATION AND CURETTAGE OF UTERUS N/A 07/14/2016   Procedure: DILATATION AND CURETTAGE;  Surgeon: Servando Salina, MD;  Location: [facility];  Service: Gynecology;  Laterality: N/A;  . LAPAROSCOPY N/A 07/14/2016   Procedure: LAPAROSCOPY DIAGNOSTIC;  Surgeon: Servando Salina, MD;  Location: [facility];  Service: Gynecology;  Laterality: N/A;  . OVARIAN CYST SURGERY  2003  SOCIAL HISTORY: Social History   Tobacco Use  . Smoking status: Current Some Day Smoker    Packs/day: 0.10    Types: Cigars  . Smokeless tobacco: Never Used  . Tobacco comment: SOCIALLY SMOKES BLACK AND MILD  Substance Use Topics  . Alcohol use: No  . Drug use: No    FAMILY HISTORY: Family History  Problem Relation Age of Onset  . Hypertension Maternal Aunt   . Hypertension Maternal Grandmother   . Hypertension Maternal Aunt   .  Hyperlipidemia Mother   . Hypertension Mother   . Cancer Mother     ROS: Review of Systems  Constitutional: Positive for malaise/fatigue and weight loss.  Gastrointestinal: Negative for nausea and vomiting.  Genitourinary: Negative for frequency.  Musculoskeletal:       Negative muscle weakness  Endo/Heme/Allergies: Negative for polydipsia.       Positive hypoglycemia Positive polyphagia    PHYSICAL EXAM: Blood pressure 109/72, pulse 76, temperature 98.3 F (36.8 C), temperature source Oral, height 5\' 3"  (1.6 m), weight 205 lb (93 kg), last menstrual period 08/20/2017, SpO2 99 %. Body mass index is 36.31 kg/m. Physical Exam  Constitutional: She is oriented to person, place, and time. She appears well-developed and well-nourished.  Cardiovascular: Normal rate.  Pulmonary/Chest: Effort normal.  Musculoskeletal: Normal range of motion.  Neurological: She is oriented to person, place, and time.  Skin: Skin is warm and dry.  Psychiatric: She has a normal mood and affect. Her behavior is normal.  Vitals reviewed.   RECENT LABS AND TESTS: BMET    Component Value Date/Time   NA 138 08/28/2017 1024   K 3.9 08/28/2017 1024   CL 103 08/28/2017 1024   CO2 21 08/28/2017 1024   GLUCOSE 70 08/28/2017 1024   GLUCOSE 93 01/19/2014 1930   BUN 9 08/28/2017 1024   CREATININE 0.85 08/28/2017 1024   CALCIUM 9.4 08/28/2017 1024   GFRNONAA 88 08/28/2017 1024   GFRAA 102 08/28/2017 1024   Lab Results  Component Value Date   HGBA1C 5.4 08/28/2017   Lab Results  Component Value Date   INSULIN 8.0 08/28/2017   CBC    Component Value Date/Time   WBC 6.0 08/28/2017 1024   WBC 6.8 07/12/2016 1005   RBC 4.69 08/28/2017 1024   RBC 4.68 07/12/2016 1005   HGB 13.9 08/28/2017 1024   HCT 40.8 08/28/2017 1024   PLT 282 07/12/2016 1005   MCV 87 08/28/2017 1024   MCH 29.6 08/28/2017 1024   MCH 29.9 07/12/2016 1005   MCHC 34.1 08/28/2017 1024   MCHC 32.6 07/12/2016 1005   RDW 12.8  08/28/2017 1024   LYMPHSABS 1.4 08/28/2017 1024   MONOABS 0.6 01/19/2014 1930   EOSABS 0.1 08/28/2017 1024   BASOSABS 0.0 08/28/2017 1024   Iron/TIBC/Ferritin/ %Sat No results found for: IRON, TIBC, FERRITIN, IRONPCTSAT Lipid Panel     Component Value Date/Time   CHOL 150 08/28/2017 1024   TRIG 63 08/28/2017 1024   HDL 49 08/28/2017 1024   LDLCALC 88 08/28/2017 1024   Hepatic Function Panel     Component Value Date/Time   PROT 7.1 08/28/2017 1024   ALBUMIN 4.3 08/28/2017 1024   AST 16 08/28/2017 1024   ALT 12 08/28/2017 1024   ALKPHOS 65 08/28/2017 1024   BILITOT 0.7 08/28/2017 1024   BILIDIR 0.1 12/09/2011 2320   IBILI 0.5 12/09/2011 2320      Component Value Date/Time   TSH 0.898 08/28/2017 1024  Results for Cheryl Friedman,  Cheryl Friedman (MRN [id number]) as of 09/11/2017 17:18  Ref. Range 08/28/2017 10:24  Vitamin D, 25-Hydroxy Latest Ref Range: 30.0 - 100.0 ng/mL 8.5 (L)    ASSESSMENT AND PLAN: Vitamin D deficiency - Plan: Vitamin D, Ergocalciferol, (DRISDOL) 50000 units CAPS capsule  Insulin resistance  At risk for diabetes mellitus  Class 2 severe obesity with serious comorbidity and body mass index (BMI) of 36.0 to 36.9 in adult, unspecified obesity type (Cheryl Friedman)  PLAN:  Vitamin D Deficiency Cheryl Friedman was informed that low vitamin D levels contributes to fatigue and are associated with obesity, breast, and colon cancer. Kayann agrees to start prescription Vit D @50 ,000 IU every 3 days #10 with no refills. She will follow up for routine testing of vitamin D, at least 2-3 times per year. She was informed of the risk of over-replacement of vitamin D and agrees to not increase her dose unless she discusses this with [place] first. We will recheck labs in 3 months. Cheryl Friedman agrees to follow up with our clinic in 2 weeks.  Insulin Resistance Cheryl Friedman will continue to work on weight loss, diet, exercise, and decreasing simple carbohydrates in her diet to help decrease the risk of diabetes.  We dicussed metformin including benefits and risks. She was informed that eating too many simple carbohydrates or too many calories at one sitting increases the likelihood of GI side effects. Gaylen declined metformin for now and prescription was not written today. We will recheck labs in 3 months. Cheryl Friedman agrees to follow up with our clinic in 2 weeks as directed to monitor her progress.  Diabetes risk counselling Cheryl Friedman was given extended (30 minutes) diabetes prevention counseling today. She is 37 y.o. female and has risk factors for diabetes including obesity and insulin resistance. We discussed intensive lifestyle modifications today with an emphasis on weight loss as well as increasing exercise and decreasing simple carbohydrates in her diet.  Obesity Cheryl Friedman is currently in the action stage of change. As such, her goal is to continue with weight loss efforts She has agreed to follow the Category 1 plan Cheryl Friedman has been instructed to work up to a goal of 150 minutes of combined cardio and strengthening exercise per week for weight loss and overall health benefits. We discussed the following Behavioral Modification Strategies today: increasing lean protein intake, decreasing simple carbohydrates, work on meal planning and easy cooking plans, and no skipping meals   Cheryl Friedman has agreed to follow up with our clinic in 2 weeks. She was informed of the importance of frequent follow up visits to maximize her success with intensive lifestyle modifications for her multiple health conditions.   OBESITY BEHAVIORAL INTERVENTION VISIT  Today's visit was # 2.   Starting weight: 206 lbs Starting date: 08/28/17 Today's weight : 205 lbs  Today's date: 09/11/2017 Total lbs lost to date: 1 At least 15 minutes were spent on discussing the following behavioral intervention visit.   ASK: We discussed the diagnosis of obesity with Cheryl Friedman today and Cheryl Friedman agreed to give [place] permission to  discuss obesity behavioral modification therapy today.  ASSESS: Atalaya has the diagnosis of obesity and her BMI today is 36.32 Cheryl Friedman is in the action stage of change   ADVISE: Cheryl Friedman was educated on the multiple health risks of obesity as well as the benefit of weight loss to improve her health. She was advised of the need for long term treatment and the importance of lifestyle modifications to improve her current health and to decrease her risk of future  health problems.  AGREE: Multiple dietary modification options and treatment options were discussed and  Cheryl Friedman agreed to follow the recommendations documented in the above note.  ARRANGE: Micheale was educated on the importance of frequent visits to treat obesity as outlined per CMS and USPSTF guidelines and agreed to schedule her next follow up appointment today.  I, Trixie Dredge, am acting as transcriptionist for Dennard Nip, MD  I have reviewed the above documentation for accuracy and completeness, and I agree with the above. -Dennard Nip, MD

## 2017-09-25 ENCOUNTER — Ambulatory Visit (INDEPENDENT_AMBULATORY_CARE_PROVIDER_SITE_OTHER): Payer: 59 | Admitting: Family Medicine

## 2017-09-25 VITALS — BP 114/72 | HR 90 | Temp 98.6°F | Ht 63.0 in | Wt 206.0 lb

## 2017-09-25 DIAGNOSIS — E559 Vitamin D deficiency, unspecified: Secondary | ICD-10-CM

## 2017-09-25 DIAGNOSIS — Z9189 Other specified personal risk factors, not elsewhere classified: Secondary | ICD-10-CM | POA: Diagnosis not present

## 2017-09-25 DIAGNOSIS — N943 Premenstrual tension syndrome: Secondary | ICD-10-CM | POA: Diagnosis not present

## 2017-09-25 DIAGNOSIS — Z6836 Body mass index (BMI) 36.0-36.9, adult: Secondary | ICD-10-CM

## 2017-09-26 MED ORDER — VITAMIN D (ERGOCALCIFEROL) 1.25 MG (50000 UNIT) PO CAPS: 50000.0000 [IU] | ORAL_CAPSULE | ORAL | 0 refills | Status: AC

## 2017-09-26 MED ORDER — IBUPROFEN 800 MG PO TABS: 800.0000 mg | ORAL_TABLET | Freq: Four times a day (QID) | ORAL | 0 refills | Status: AC | PRN

## 2017-09-28 NOTE — Progress Notes (Signed)
Office: [phone]  /  Fax: [phone]   HPI:   Chief Complaint: OBESITY Cheryl Friedman is here to discuss her progress with her obesity treatment plan. She is on the Category 1 plan and is following her eating plan approximately 80 % of the time. She states she is walking for 30 minutes 2 times per week. Cheryl Friedman is retaining a bit of water weight but she has done well overall with diet prescription. She states her hunger is controlled and she feels good with walking 2 times per week.  Her weight is 206 lb (93.4 kg) today and has gained 1 pound since her last visit. She has lost 0 lbs since starting treatment with [place].  Vitamin D Deficiency Cheryl Friedman has a diagnosis of vitamin D deficiency. She is stable on prescription Vit D and denies nausea, vomiting or muscle weakness.  Premenstrual Syndrome Cheryl Friedman is stable on ibuprofen as needed and she requests a refill today.   At risk for cardiovascular disease Cheryl Friedman is at a higher than average risk for cardiovascular disease due to obesity. She currently denies any chest pain.  ALLERGIES: Allergies  Allergen Reactions  . Coconut Oil Swelling    Throat and eye swelling  . Gadolinium Derivatives     Pt got nauseated and vomitted.  Inject slowly next time.  . Latex Swelling    gloves    MEDICATIONS: Current Outpatient Medications on File Prior to Visit  Medication Sig Dispense Refill  . norethindrone-ethinyl estradiol (FEMHRT 1/5) 1-5 MG-MCG TABS tablet Take 1 tablet by mouth daily. (Patient taking differently: Take 1 tablet by mouth at bedtime. ) 28 tablet 0  . [DISCONTINUED] omeprazole (PRILOSEC) 20 MG capsule Take 1 capsule (20 mg total) by mouth daily. (Patient not taking: Reported on 12/25/2013) 30 capsule 0  . [DISCONTINUED] promethazine (PHENERGAN) 12.5 MG suppository Place 1 suppository (12.5 mg total) rectally every 6 (six) hours as needed for nausea. (Patient not taking: Reported on 12/25/2013) 12 each 0   No current  facility-administered medications on file prior to visit.     PAST MEDICAL HISTORY: Past Medical History:  Diagnosis Date  . Back pain   . Dyspnea   . Endometriosis   . GERD (gastroesophageal reflux disease)   . Joint pain   . Leg pain   . PONV (postoperative nausea and vomiting)     PAST SURGICAL HISTORY: Past Surgical History:  Procedure Laterality Date  . COLPOSCOPY  08/2012  . DILATION AND CURETTAGE OF UTERUS N/A 07/14/2016   Procedure: DILATATION AND CURETTAGE;  Surgeon: Servando Salina, MD;  Location: [facility];  Service: Gynecology;  Laterality: N/A;  . LAPAROSCOPY N/A 07/14/2016   Procedure: LAPAROSCOPY DIAGNOSTIC;  Surgeon: Servando Salina, MD;  Location: [facility];  Service: Gynecology;  Laterality: N/A;  . OVARIAN CYST SURGERY  2003    SOCIAL HISTORY: Social History   Tobacco Use  . Smoking status: Current Some Day Smoker    Packs/day: 0.10    Types: Cigars  . Smokeless tobacco: Never Used  . Tobacco comment: SOCIALLY SMOKES BLACK AND MILD  Substance Use Topics  . Alcohol use: No  . Drug use: No    FAMILY HISTORY: Family History  Problem Relation Age of Onset  . Hypertension Maternal Aunt   . Hypertension Maternal Grandmother   . Hypertension Maternal Aunt   . Hyperlipidemia Mother   . Hypertension Mother   . Cancer Mother     ROS: Review of Systems  Constitutional: Negative for weight loss.  Cardiovascular:  Negative for chest pain.  Gastrointestinal: Negative for nausea and vomiting.  Musculoskeletal:       Negative muscle weakness    PHYSICAL EXAM: Blood pressure 114/72, pulse 90, temperature 98.6 F (37 C), temperature source Oral, height 5\' 3"  (1.6 m), weight 206 lb (93.4 kg), SpO2 97 %. Body mass index is 36.49 kg/m. Physical Exam  Constitutional: She is oriented to person, place, and time. She appears well-developed and well-nourished.  Cardiovascular: Normal rate.  Pulmonary/Chest: Effort normal.  Musculoskeletal: Normal range of  motion.  Neurological: She is oriented to person, place, and time.  Skin: Skin is warm and dry.  Psychiatric: She has a normal mood and affect. Her behavior is normal.  Vitals reviewed.   RECENT LABS AND TESTS: BMET    Component Value Date/Time   NA 138 08/28/2017 1024   K 3.9 08/28/2017 1024   CL 103 08/28/2017 1024   CO2 21 08/28/2017 1024   GLUCOSE 70 08/28/2017 1024   GLUCOSE 93 01/19/2014 1930   BUN 9 08/28/2017 1024   CREATININE 0.85 08/28/2017 1024   CALCIUM 9.4 08/28/2017 1024   GFRNONAA 88 08/28/2017 1024   GFRAA 102 08/28/2017 1024   Lab Results  Component Value Date   HGBA1C 5.4 08/28/2017   Lab Results  Component Value Date   INSULIN 8.0 08/28/2017   CBC    Component Value Date/Time   WBC 6.0 08/28/2017 1024   WBC 6.8 07/12/2016 1005   RBC 4.69 08/28/2017 1024   RBC 4.68 07/12/2016 1005   HGB 13.9 08/28/2017 1024   HCT 40.8 08/28/2017 1024   PLT 282 07/12/2016 1005   MCV 87 08/28/2017 1024   MCH 29.6 08/28/2017 1024   MCH 29.9 07/12/2016 1005   MCHC 34.1 08/28/2017 1024   MCHC 32.6 07/12/2016 1005   RDW 12.8 08/28/2017 1024   LYMPHSABS 1.4 08/28/2017 1024   MONOABS 0.6 01/19/2014 1930   EOSABS 0.1 08/28/2017 1024   BASOSABS 0.0 08/28/2017 1024   Iron/TIBC/Ferritin/ %Sat No results found for: IRON, TIBC, FERRITIN, IRONPCTSAT Lipid Panel     Component Value Date/Time   CHOL 150 08/28/2017 1024   TRIG 63 08/28/2017 1024   HDL 49 08/28/2017 1024   LDLCALC 88 08/28/2017 1024   Hepatic Function Panel     Component Value Date/Time   PROT 7.1 08/28/2017 1024   ALBUMIN 4.3 08/28/2017 1024   AST 16 08/28/2017 1024   ALT 12 08/28/2017 1024   ALKPHOS 65 08/28/2017 1024   BILITOT 0.7 08/28/2017 1024   BILIDIR 0.1 12/09/2011 2320   IBILI 0.5 12/09/2011 2320      Component Value Date/Time   TSH 0.898 08/28/2017 1024  Results for CHAREE, TUMBLIN (MRN [id number]) as of 09/28/2017 08:09  Ref. Range 08/28/2017 10:24  Vitamin D, 25-Hydroxy Latest  Ref Range: 30.0 - 100.0 ng/mL 8.5 (L)    ASSESSMENT AND PLAN: Vitamin D deficiency - Plan: Vitamin D, Ergocalciferol, (DRISDOL) 50000 units CAPS capsule  PMS (premenstrual syndrome) - Plan: ibuprofen (ADVIL,MOTRIN) 800 MG tablet  At risk for heart disease  Class 2 severe obesity with serious comorbidity and body mass index (BMI) of 36.0 to 36.9 in adult, unspecified obesity type (HCC)  PLAN:  Vitamin D Deficiency Luke was informed that low vitamin D levels contributes to fatigue and are associated with obesity, breast, and colon cancer. [place] agrees to continue taking prescription Vit D @50 ,000 IU every 3 days #10 and we will refill for 1 month. She will follow up for  routine testing of vitamin D, at least 2-3 times per year. She was informed of the risk of over-replacement of vitamin D and agrees to not increase her dose unless she discusses this with [place] first. Markasia agrees to follow up with our clinic in 2 weeks with Hoyle Sauer, our registered dietitian and in 4 weeks with myself.  Premenstrual Syndrome Kalanie agrees to continue taking ibuprofen 800 mg q 6 hours as needed #50 and we will refill for 1 month. Alita was educated on trying a lower dose of 400 mg first to help avoid renal and GI dysfunction and she agreed. Naylah agrees to follow up with our clinic in 2 weeks with Hoyle Sauer, our registered dietitian and in 4 weeks with myself.  Cardiovascular risk counselling Elfreda was given extended (15 minutes) coronary artery disease prevention counseling today. She is 37 y.o. female and has risk factors for heart disease including obesity. We discussed intensive lifestyle modifications today with an emphasis on specific weight loss instructions and strategies. Pt was also informed of the importance of increasing exercise and decreasing saturated fats to help prevent heart disease.  Obesity Waylon is currently in the action stage of change. As such, her goal is to continue  with weight loss efforts She has agreed to follow the Category 1 plan Janecia has been instructed to work up to a goal of 150 minutes of combined cardio and strengthening exercise per week for weight loss and overall health benefits. We discussed the following Behavioral Modification Strategies today: work on meal planning and easy cooking plans, dealing with family or coworker sabotage and emotional eating strategies   Leandra has agreed to follow up with our clinic in 2 weeks with Hoyle Sauer, our registered dietitian and in 4 weeks with myself. She was informed of the importance of frequent follow up visits to maximize her success with intensive lifestyle modifications for her multiple health conditions.   OBESITY BEHAVIORAL INTERVENTION VISIT  Today's visit was # 3   Starting weight: 206 lbs Starting date: 08/28/17 Today's weight : 206 lbs  Today's date: 09/25/2017 Total lbs lost to date: 0    ASK: We discussed the diagnosis of obesity with Reylene Lintner today and Katrine agreed to give [place] permission to discuss obesity behavioral modification therapy today.  ASSESS: Zarria has the diagnosis of obesity and her BMI today is 36.5 Brenna is in the action stage of change   ADVISE: Mackie was educated on the multiple health risks of obesity as well as the benefit of weight loss to improve her health. She was advised of the need for long term treatment and the importance of lifestyle modifications to improve her current health and to decrease her risk of future health problems.  AGREE: Multiple dietary modification options and treatment options were discussed and  Caddie agreed to follow the recommendations documented in the above note.  ARRANGE: Yuko was educated on the importance of frequent visits to treat obesity as outlined per CMS and USPSTF guidelines and agreed to schedule her next follow up appointment today.  I, Trixie Dredge, am acting as transcriptionist for  Dennard Nip, MD  I have reviewed the above documentation for accuracy and completeness, and I agree with the above. -Dennard Nip, MD

## 2017-10-09 ENCOUNTER — Ambulatory Visit (INDEPENDENT_AMBULATORY_CARE_PROVIDER_SITE_OTHER): Payer: 59 | Admitting: Dietician

## 2017-10-10 ENCOUNTER — Ambulatory Visit (INDEPENDENT_AMBULATORY_CARE_PROVIDER_SITE_OTHER): Payer: 59 | Admitting: Dietician

## 2017-10-10 VITALS — Ht 63.0 in | Wt 207.0 lb

## 2017-10-10 DIAGNOSIS — Z9189 Other specified personal risk factors, not elsewhere classified: Secondary | ICD-10-CM | POA: Diagnosis not present

## 2017-10-10 DIAGNOSIS — E8881 Metabolic syndrome: Secondary | ICD-10-CM | POA: Diagnosis not present

## 2017-10-10 DIAGNOSIS — Z6836 Body mass index (BMI) 36.0-36.9, adult: Secondary | ICD-10-CM

## 2017-10-11 NOTE — Progress Notes (Signed)
  Office: [phone]  /  Fax: [phone]     Cheryl Friedman has a diagnosis of insulin resistance based on her elevated fasting insulin level >5. Although Cheryl Friedman's blood glucose readings are still under good control, insulin resistance puts her at greater risk of metabolic syndrome and diabetes. She is here today for diabetes risk nutrition education which includes her obesity treatment plan.   Cheryl Friedman weight today is 207 lbs, a 1 lb weight gain since her last visit, however she retaining fluid and per bioimpedience measures she appears to be retaining fluid with approximately a 3 lb fat loss since her last visit. She report following her category 1 meal plan 65-75% of the time. She has been doing well at adjusting the meal plan to fit her daily schedule. Menu and meal planning options were discussed in detail as well as ways to individualize the meal plan for variety. Healthy snack options were also discussed. She agreed to continue the category 1 meal plan until her next visit.   Also reviewed with pateint food nutrients ie protein, fats, simple and complex carbohydrates and how these affect insulin response and her weight.  Cheryl Friedman is on the following meal plan Category 1 Her meal plan was individualized for maximum benefit.  Also discussed at length the following behavioral modifications to help maximize success:  increase water intake,  meal planning and cooking strategies, keeping healthy foods in the home,  better snacking options,  planning for success.    Cheryl Friedman has been instructed to work up to a goal of 150 minutes of combined cardio and strengthening exercise per week for weight loss and overall health benefits. She reports she is walking for 30 minutes per day twice a week.   OBESITY BEHAVIORAL INTERVENTION VISIT  Today's visit was # 4   Starting weight: 206 Starting date: 08/28/17 Today's weight : Weight: 207 lb (93.9 kg)  Today's date:10/10/17 Total lbs lost to date: 0 At  least 15 minutes were spent on discussing the following behavioral intervention visit.   ASK: We discussed the diagnosis of obesity with Cheryl Friedman today and Cheryl Friedman agreed to give [place] permission to discuss obesity behavioral modification therapy today.  ASSESS: Cheryl Friedman has the diagnosis of obesity and her BMI today is 36.7 Cheryl Friedman is in the action stage of change   ADVISE: Cheryl Friedman was educated on the multiple health risks of obesity as well as the benefit of weight loss to improve her health. She was advised of the need for long term treatment and the importance of lifestyle modifications to improve her current health and to decrease her risk of future health problems.  AGREE: Multiple dietary modification options and treatment options were discussed and  Cheryl Friedman agreed to follow the recommendations documented in the above note.  ARRANGE: Cheryl Friedman was educated on the importance of frequent visits to treat obesity as outlined per CMS and USPSTF guidelines and agreed to schedule her next follow up appointment today.

## 2017-10-23 ENCOUNTER — Ambulatory Visit (INDEPENDENT_AMBULATORY_CARE_PROVIDER_SITE_OTHER): Payer: 59 | Admitting: Family Medicine

## 2017-10-23 VITALS — BP 107/69 | HR 81 | Temp 98.6°F | Ht 63.0 in | Wt 207.0 lb

## 2017-10-23 DIAGNOSIS — E8881 Metabolic syndrome: Secondary | ICD-10-CM | POA: Diagnosis not present

## 2017-10-23 DIAGNOSIS — Z9189 Other specified personal risk factors, not elsewhere classified: Secondary | ICD-10-CM | POA: Diagnosis not present

## 2017-10-23 DIAGNOSIS — Z6836 Body mass index (BMI) 36.0-36.9, adult: Secondary | ICD-10-CM

## 2017-10-23 MED ORDER — METFORMIN HCL 500 MG PO TABS: 500.0000 mg | ORAL_TABLET | Freq: Every day | ORAL | 0 refills | Status: DC

## 2017-10-24 ENCOUNTER — Encounter: Payer: Self-pay | Admitting: Sports Medicine

## 2017-10-24 ENCOUNTER — Ambulatory Visit: Payer: 59 | Admitting: Sports Medicine

## 2017-10-24 ENCOUNTER — Other Ambulatory Visit: Payer: Self-pay

## 2017-10-24 VITALS — BP 128/91 | HR 92

## 2017-10-24 DIAGNOSIS — M79675 Pain in left toe(s): Secondary | ICD-10-CM | POA: Diagnosis not present

## 2017-10-24 DIAGNOSIS — B9689 Other specified bacterial agents as the cause of diseases classified elsewhere: Secondary | ICD-10-CM | POA: Insufficient documentation

## 2017-10-24 DIAGNOSIS — M79674 Pain in right toe(s): Secondary | ICD-10-CM

## 2017-10-24 DIAGNOSIS — L600 Ingrowing nail: Secondary | ICD-10-CM

## 2017-10-24 DIAGNOSIS — B373 Candidiasis of vulva and vagina: Secondary | ICD-10-CM | POA: Insufficient documentation

## 2017-10-24 DIAGNOSIS — B3731 Acute candidiasis of vulva and vagina: Secondary | ICD-10-CM | POA: Insufficient documentation

## 2017-10-24 DIAGNOSIS — N760 Acute vaginitis: Secondary | ICD-10-CM | POA: Insufficient documentation

## 2017-10-24 DIAGNOSIS — L239 Allergic contact dermatitis, unspecified cause: Secondary | ICD-10-CM | POA: Insufficient documentation

## 2017-10-24 NOTE — Progress Notes (Signed)
Subjective: Cheryl Friedman is a 37 y.o. female patient presents to office today complaining of bilateral ingrown toenails.  States that with pressure was used the insides of both her big toe seems to hurt states that she has a family history of ingrown toenails and has seen other family members go through the procedure and she is not sure that she wants the procedure done however she does notice that it has becoming more constant but when she does have to wear shoes that are closed and that her toes hurt states that she goes for pedicures but this has still not giving her any relief.  Patient denies any redness warmth swelling drainage or any other constitutional symptoms at this time.  Review of Systems  All other systems reviewed and are negative.    Patient Active Problem List   Diagnosis Date Noted  . Allergic contact dermatitis 10/24/2017  . Bacterial vaginosis 10/24/2017  . Candidal vulvovaginitis 10/24/2017  . Abnormal cervical Pap smear with positive HPV DNA test 11/05/2014    Current Outpatient Medications on File Prior to Visit  Medication Sig Dispense Refill  . ibuprofen (ADVIL,MOTRIN) 800 MG tablet Take 1 tablet (800 mg total) by mouth every 6 (six) hours as needed for mild pain. 50 tablet 0  . metFORMIN (GLUCOPHAGE) 500 MG tablet Take 1 tablet (500 mg total) by mouth daily with breakfast. 30 tablet 0  . norethindrone-ethinyl estradiol (FEMHRT 1/5) 1-5 MG-MCG TABS tablet Take 1 tablet by mouth daily. (Patient taking differently: Take 1 tablet by mouth at bedtime. ) 28 tablet 0  . Vitamin D, Ergocalciferol, (DRISDOL) 50000 units CAPS capsule Take 1 capsule (50,000 Units total) by mouth every 3 (three) days. 10 capsule 0  . [DISCONTINUED] omeprazole (PRILOSEC) 20 MG capsule Take 1 capsule (20 mg total) by mouth daily. (Patient not taking: Reported on 12/25/2013) 30 capsule 0  . [DISCONTINUED] promethazine (PHENERGAN) 12.5 MG suppository Place 1 suppository (12.5 mg total) rectally  every 6 (six) hours as needed for nausea. (Patient not taking: Reported on 12/25/2013) 12 each 0   No current facility-administered medications on file prior to visit.     Allergies  Allergen Reactions  . Coconut Oil Swelling    Throat and eye swelling  . Gadolinium Derivatives     Pt got nauseated and vomitted.  Inject slowly next time.  . Latex Swelling    gloves    Objective:  There were no vitals filed for this visit.  General: Well developed, nourished, in no acute distress, alert and oriented x3   Dermatology: Skin is warm, dry and supple bilateral.  Mild incurvation bilateral hallux medial borders (-) Erythema. (-) Edema. (-) serosanguous drainage present. The remaining nails appear unremarkable at this time. There are no open sores, lesions or other signs of infection  present.  Vascular: Dorsalis Pedis artery and Posterior Tibial artery pedal pulses are 2/4 bilateral with immedate capillary fill time. Pedal hair growth present. No lower extremity edema.   Neruologic: Grossly intact via light touch bilateral.  Musculoskeletal: Minimal reproducible tenderness at medial hallux bilateral. Muscular strength within normal limits in all groups bilateral.   Assesement and Plan: Problem List Items Addressed This Visit    None    Visit Diagnoses    Ingrown toenail    -  Primary   Toe pain, bilateral          -Discussed treatment alternatives and plan of care; Explained permanent/temporary nail avulsion and post procedure course to patient.  Patient  declines procedure to be performed today.   -Advised patient to come back to the office whenever she is ready to have her ingrown toenails taken care of and to have her toenail polish removed at that visit. -Meanwhile continue with good supportive shoes to prevent rubbing or irritation and may continue with other treatments like Epson salt soaks or antibiotic or ingrown nail cream -Patient is to return when ready for nail procedure  or sooner if problems arise.  Landis Martins, DPM

## 2017-10-24 NOTE — Progress Notes (Signed)
Office: [phone]  /  Fax: [phone]   HPI:   Chief Complaint: OBESITY Cheryl Friedman is here to discuss her progress with her obesity treatment plan. She is on the Category 1 plan and is following her eating plan approximately 70-75 % of the time. She states she is exercising 0 minutes 0 times per week. Cheryl Friedman has done well with maintaining weight, but she is struggling to meal prep due to working longer hours. She is still doing more grab and go eating. She notes hunger is more of an issue than cravings.  Her weight is 207 lb (93.9 kg) today and has not lost weight since her last visit. She has lost 0 lbs since starting treatment with [place].  Insulin Resistance Terrian has a diagnosis of insulin resistance based on her elevated fasting insulin level >5. Although Cheryl Friedman's blood glucose readings are still under good control, insulin resistance puts her at greater risk of metabolic syndrome and diabetes. She is not taking metformin currently and notes polyphagia, and she is struggling to follow her diet prescription closely. She continues to work on diet and exercise to decrease risk of diabetes.  At risk for diabetes Cheryl Friedman is at higher than average risk for developing diabetes due to her obesity and insulin resistance. She currently denies polyuria or polydipsia.  ALLERGIES: Allergies  Allergen Reactions  . Coconut Oil Swelling    Throat and eye swelling  . Gadolinium Derivatives     Pt got nauseated and vomitted.  Inject slowly next time.  . Latex Swelling    gloves    MEDICATIONS: Current Outpatient Medications on File Prior to Visit  Medication Sig Dispense Refill  . ibuprofen (ADVIL,MOTRIN) 800 MG tablet Take 1 tablet (800 mg total) by mouth every 6 (six) hours as needed for mild pain. 50 tablet 0  . norethindrone-ethinyl estradiol (FEMHRT 1/5) 1-5 MG-MCG TABS tablet Take 1 tablet by mouth daily. (Patient taking differently: Take 1 tablet by mouth at bedtime. ) 28 tablet 0   . Vitamin D, Ergocalciferol, (DRISDOL) 50000 units CAPS capsule Take 1 capsule (50,000 Units total) by mouth every 3 (three) days. 10 capsule 0  . [DISCONTINUED] omeprazole (PRILOSEC) 20 MG capsule Take 1 capsule (20 mg total) by mouth daily. (Patient not taking: Reported on 12/25/2013) 30 capsule 0  . [DISCONTINUED] promethazine (PHENERGAN) 12.5 MG suppository Place 1 suppository (12.5 mg total) rectally every 6 (six) hours as needed for nausea. (Patient not taking: Reported on 12/25/2013) 12 each 0   No current facility-administered medications on file prior to visit.     PAST MEDICAL HISTORY: Past Medical History:  Diagnosis Date  . Back pain   . Dyspnea   . Endometriosis   . GERD (gastroesophageal reflux disease)   . Joint pain   . Leg pain   . PONV (postoperative nausea and vomiting)     PAST SURGICAL HISTORY: Past Surgical History:  Procedure Laterality Date  . COLPOSCOPY  08/2012  . DILATION AND CURETTAGE OF UTERUS N/A 07/14/2016   Procedure: DILATATION AND CURETTAGE;  Surgeon: Servando Salina, MD;  Location: [facility];  Service: Gynecology;  Laterality: N/A;  . LAPAROSCOPY N/A 07/14/2016   Procedure: LAPAROSCOPY DIAGNOSTIC;  Surgeon: Servando Salina, MD;  Location: [facility];  Service: Gynecology;  Laterality: N/A;  . OVARIAN CYST SURGERY  2003    SOCIAL HISTORY: Social History   Tobacco Use  . Smoking status: Current Some Day Smoker    Packs/day: 0.10    Types: Cigars  . Smokeless  tobacco: Never Used  . Tobacco comment: SOCIALLY SMOKES BLACK AND MILD  Substance Use Topics  . Alcohol use: No  . Drug use: No    FAMILY HISTORY: Family History  Problem Relation Age of Onset  . Hypertension Maternal Aunt   . Hypertension Maternal Grandmother   . Hypertension Maternal Aunt   . Hyperlipidemia Mother   . Hypertension Mother   . Cancer Mother     ROS: Review of Systems  Constitutional: Negative for weight loss.  Genitourinary: Negative for frequency.    Endo/Heme/Allergies: Negative for polydipsia.       Negative polyphagia    PHYSICAL EXAM: Blood pressure 107/69, pulse 81, temperature 98.6 F (37 C), temperature source Oral, height 5\' 3"  (1.6 m), weight 207 lb (93.9 kg), SpO2 98 %. Body mass index is 36.67 kg/m. Physical Exam  Constitutional: She is oriented to person, place, and time. She appears well-developed and well-nourished.  Cardiovascular: Normal rate.  Pulmonary/Chest: Effort normal.  Musculoskeletal: Normal range of motion.  Neurological: She is oriented to person, place, and time.  Skin: Skin is warm and dry.  Psychiatric: She has a normal mood and affect. Her behavior is normal.  Vitals reviewed.   RECENT LABS AND TESTS: BMET    Component Value Date/Time   NA 138 08/28/2017 1024   K 3.9 08/28/2017 1024   CL 103 08/28/2017 1024   CO2 21 08/28/2017 1024   GLUCOSE 70 08/28/2017 1024   GLUCOSE 93 01/19/2014 1930   BUN 9 08/28/2017 1024   CREATININE 0.85 08/28/2017 1024   CALCIUM 9.4 08/28/2017 1024   GFRNONAA 88 08/28/2017 1024   GFRAA 102 08/28/2017 1024   Lab Results  Component Value Date   HGBA1C 5.4 08/28/2017   Lab Results  Component Value Date   INSULIN 8.0 08/28/2017   CBC    Component Value Date/Time   WBC 6.0 08/28/2017 1024   WBC 6.8 07/12/2016 1005   RBC 4.69 08/28/2017 1024   RBC 4.68 07/12/2016 1005   HGB 13.9 08/28/2017 1024   HCT 40.8 08/28/2017 1024   PLT 282 07/12/2016 1005   MCV 87 08/28/2017 1024   MCH 29.6 08/28/2017 1024   MCH 29.9 07/12/2016 1005   MCHC 34.1 08/28/2017 1024   MCHC 32.6 07/12/2016 1005   RDW 12.8 08/28/2017 1024   LYMPHSABS 1.4 08/28/2017 1024   MONOABS 0.6 01/19/2014 1930   EOSABS 0.1 08/28/2017 1024   BASOSABS 0.0 08/28/2017 1024   Iron/TIBC/Ferritin/ %Sat No results found for: IRON, TIBC, FERRITIN, IRONPCTSAT Lipid Panel     Component Value Date/Time   CHOL 150 08/28/2017 1024   TRIG 63 08/28/2017 1024   HDL 49 08/28/2017 1024   LDLCALC 88  08/28/2017 1024   Hepatic Function Panel     Component Value Date/Time   PROT 7.1 08/28/2017 1024   ALBUMIN 4.3 08/28/2017 1024   AST 16 08/28/2017 1024   ALT 12 08/28/2017 1024   ALKPHOS 65 08/28/2017 1024   BILITOT 0.7 08/28/2017 1024   BILIDIR 0.1 12/09/2011 2320   IBILI 0.5 12/09/2011 2320      Component Value Date/Time   TSH 0.898 08/28/2017 1024    ASSESSMENT AND PLAN: Insulin resistance - Plan: metFORMIN (GLUCOPHAGE) 500 MG tablet  At risk for diabetes mellitus  Class 2 severe obesity with serious comorbidity and body mass index (BMI) of 36.0 to 36.9 in adult, unspecified obesity type ([place])  PLAN:  Insulin Resistance Anh will continue to work on weight loss, exercise,  and decreasing simple carbohydrates in her diet to help decrease the risk of diabetes. We dicussed metformin including benefits and risks. She was informed that eating too many simple carbohydrates or too many calories at one sitting increases the likelihood of GI side effects. Marylynn agrees to start metformin 500 mg q AM #30 with no refills, and she is to get back to diet. Draven agrees to follow up with our clinic in 2 to 3 weeks as directed to monitor her progress.  Diabetes risk counselling Mercy was given extended (15 minutes) diabetes prevention counseling today. She is 37 y.o. female and has risk factors for diabetes including obesity and insulin resistance. We discussed intensive lifestyle modifications today with an emphasis on weight loss as well as increasing exercise and decreasing simple carbohydrates in her diet.  Obesity Nellene is currently in the action stage of change. As such, her goal is to continue with weight loss efforts She has agreed to follow the Category 1 plan Chantae has been instructed to work up to a goal of 150 minutes of combined cardio and strengthening exercise per week for weight loss and overall health benefits. We discussed the following Behavioral  Modification Strategies today: increasing lean protein intake, decreasing simple carbohydrates, work on meal planning and easy cooking plans, emotional eating strategies, and no skipping meals We discussed various medication options to help Rakel with her weight loss efforts and Aubryn was reminded that the eating plan curves her weight loss, but medications can help her follow her plan.  Montie has agreed to follow up with our clinic in 2 to 3 weeks. She was informed of the importance of frequent follow up visits to maximize her success with intensive lifestyle modifications for her multiple health conditions.   OBESITY BEHAVIORAL INTERVENTION VISIT  Today's visit was # 4   Starting weight: 206 lbs Starting date: 08/28/17 Today's weight : 207 lbs Today's date: 10/23/2017 Total lbs lost to date: 0    ASK: We discussed the diagnosis of obesity with Janazia Wynia today and Ambra agreed to give [place] permission to discuss obesity behavioral modification therapy today.  ASSESS: Randye has the diagnosis of obesity and her BMI today is 36.68 Amayrani is in the action stage of change   ADVISE: Rockell was educated on the multiple health risks of obesity as well as the benefit of weight loss to improve her health. She was advised of the need for long term treatment and the importance of lifestyle modifications to improve her current health and to decrease her risk of future health problems.  AGREE: Multiple dietary modification options and treatment options were discussed and  Tasheka agreed to follow the recommendations documented in the above note.  ARRANGE: Fallyn was educated on the importance of frequent visits to treat obesity as outlined per CMS and USPSTF guidelines and agreed to schedule her next follow up appointment today.  I, Trixie Dredge, am acting as transcriptionist for Dennard Nip, MD  I have reviewed the above documentation for accuracy and completeness,  and I agree with the above. -Dennard Nip, MD

## 2017-10-31 ENCOUNTER — Encounter: Payer: Self-pay | Admitting: Sports Medicine

## 2017-10-31 ENCOUNTER — Ambulatory Visit: Payer: 59 | Admitting: Sports Medicine

## 2017-10-31 DIAGNOSIS — M79675 Pain in left toe(s): Secondary | ICD-10-CM

## 2017-10-31 DIAGNOSIS — L600 Ingrowing nail: Secondary | ICD-10-CM

## 2017-10-31 DIAGNOSIS — M79674 Pain in right toe(s): Secondary | ICD-10-CM

## 2017-10-31 NOTE — Patient Instructions (Signed)

## 2017-10-31 NOTE — Progress Notes (Signed)
Subjective: Cheryl Friedman is a 37 y.o. female patient returns office today for follow-up evaluation of bilateral ingrown toenails and states that she is now ready for procedure and has removed polish off both big toes.  Patient denies any changes with medical history since last week.  Patient denies any redness warmth swelling drainage or any other constitutional symptoms at this time.    Patient Active Problem List   Diagnosis Date Noted  . Allergic contact dermatitis 10/24/2017  . Bacterial vaginosis 10/24/2017  . Candidal vulvovaginitis 10/24/2017  . Abnormal cervical Pap smear with positive HPV DNA test 11/05/2014    Current Outpatient Medications on File Prior to Visit  Medication Sig Dispense Refill  . clotrimazole-betamethasone (LOTRISONE) cream clotrimazole-betamethasone 1 %-0.05 % topical cream  APPLY TO AFFECTED AREA 2 TIMES DAILY    . cyclobenzaprine (FLEXERIL) 10 MG tablet 1 tab po qhs up to TID as needed for back spasms    . HYDROcodone-acetaminophen (NORCO/VICODIN) 5-325 MG tablet hydrocodone 5 mg-acetaminophen 325 mg tablet  TAKE 1 TABLET BY MOUTH EVERY 4 TO 6 HOURS AS NEEDED FOR PAIN    . ibuprofen (ADVIL,MOTRIN) 800 MG tablet Take 1 tablet (800 mg total) by mouth every 6 (six) hours as needed for mild pain. 50 tablet 0  . metFORMIN (GLUCOPHAGE) 500 MG tablet Take 1 tablet (500 mg total) by mouth daily with breakfast. 30 tablet 0  . naproxen sodium (ANAPROX) 550 MG tablet naproxen sodium 550 mg tablet  TAKE 1 TABLET EVERY 12 HOURS AS NEEDED    . norethindrone (MICRONOR,CAMILA,ERRIN) 0.35 MG tablet     . norethindrone-ethinyl estradiol (FEMHRT 1/5) 1-5 MG-MCG TABS tablet Take 1 tablet by mouth daily. (Patient taking differently: Take 1 tablet by mouth at bedtime. ) 28 tablet 0  . Norgestimate-Ethinyl Estradiol Triphasic 0.18/0.215/0.25 MG-35 MCG tablet Take by mouth.    . nystatin cream (MYCOSTATIN)     . predniSONE (STERAPRED UNI-PAK 21 TAB) 10 MG (21) TBPK tablet  Sterapred DS 6 days as directed    . triamcinolone cream (KENALOG) 0.1 %     . Vitamin D, Ergocalciferol, (DRISDOL) 50000 units CAPS capsule Take 1 capsule (50,000 Units total) by mouth every 3 (three) days. 10 capsule 0  . Vitamins/Minerals TABS Take by mouth.    . [DISCONTINUED] omeprazole (PRILOSEC) 20 MG capsule Take 1 capsule (20 mg total) by mouth daily. (Patient not taking: Reported on 12/25/2013) 30 capsule 0  . [DISCONTINUED] promethazine (PHENERGAN) 12.5 MG suppository Place 1 suppository (12.5 mg total) rectally every 6 (six) hours as needed for nausea. (Patient not taking: Reported on 12/25/2013) 12 each 0   No current facility-administered medications on file prior to visit.     Allergies  Allergen Reactions  . Coconut Oil Swelling    Throat and eye swelling  . Gadolinium Derivatives     Pt got nauseated and vomitted.  Inject slowly next time.  . Latex Swelling    gloves    Objective:  There were no vitals filed for this visit.  General: Well developed, nourished, in no acute distress, alert and oriented x3   Dermatology: Skin is warm, dry and supple bilateral.  Mild incurvation bilateral hallux medial borders (-) Erythema. (-) Edema. (-) serosanguous drainage present. The remaining nails appear unremarkable at this time. There are no open sores, lesions or other signs of infection  present.  Vascular: Dorsalis Pedis artery and Posterior Tibial artery pedal pulses are 2/4 bilateral with immedate capillary fill time. Pedal hair growth present.  No lower extremity edema.   Neruologic: Grossly intact via light touch bilateral.  Musculoskeletal: Minimal reproducible tenderness at medial hallux bilateral. Muscular strength within normal limits in all groups bilateral.   Assesement and Plan: Problem List Items Addressed This Visit    None    Visit Diagnoses    Ingrown toenail    -  Primary   medial margins bil hallux   Toe pain, bilateral          -Discussed treatment  alternatives and plan of care; Explained permanent/temporary nail avulsion and post procedure course to patient.  Patient opts for permanent medial hallux nail avulsion procedure to be done bilateral at today's visit. - After a verbal and written consent, injected 3 ml of a 50:50 mixture of 2% plain  lidocaine and 0.5% plain marcaine in a normal hallux block fashion. Next, a  betadine prep was performed. Anesthesia was tested and found to be appropriate.  The offending right and left hallux medial nail borders were then incised from the hyponychium to the epinychium. The offending nail border was removed and cleared from the field. The area was curretted for any remaining nail or spicules. Phenol application performed and the area was then flushed with alcohol and dressed with antibiotic cream and a dry sterile dressing. -Patient was instructed to leave the dressing intact for today and begin soaking  in a weak solution of Epson salt and water tomorrow. Patient was instructed to  soak for 15 minutes each day and apply neosporin and a gauze or bandaid dressing each day. -Patient was instructed to monitor the toes for signs of infection and return to office if toe becomes red, hot or swollen. -Patient to return in 2 weeks for nail check with Janett Billow q. or sooner if problems or issues arise  Landis Martins, DPM

## 2017-11-02 ENCOUNTER — Encounter: Payer: Self-pay | Admitting: *Deleted

## 2017-11-02 ENCOUNTER — Telehealth: Payer: Self-pay | Admitting: Sports Medicine

## 2017-11-02 NOTE — Telephone Encounter (Signed)
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 I told pt I would have letter ready for pick up and emailed.

## 2017-11-02 NOTE — Telephone Encounter (Signed)
I had a toenail procedure on Monday. I have a question about a letter for work about the type of shoe wear I can have at my job. Please call me back at [phone]. Thank you.

## 2017-11-06 ENCOUNTER — Ambulatory Visit (INDEPENDENT_AMBULATORY_CARE_PROVIDER_SITE_OTHER): Payer: 59 | Admitting: Family Medicine

## 2017-11-06 ENCOUNTER — Encounter (INDEPENDENT_AMBULATORY_CARE_PROVIDER_SITE_OTHER): Payer: Self-pay | Admitting: Family Medicine

## 2017-11-06 VITALS — BP 108/71 | HR 92 | Temp 98.9°F | Ht 63.0 in | Wt 206.0 lb

## 2017-11-06 DIAGNOSIS — Z6836 Body mass index (BMI) 36.0-36.9, adult: Secondary | ICD-10-CM

## 2017-11-06 DIAGNOSIS — E559 Vitamin D deficiency, unspecified: Secondary | ICD-10-CM

## 2017-11-06 DIAGNOSIS — E88819 Insulin resistance, unspecified: Secondary | ICD-10-CM

## 2017-11-06 DIAGNOSIS — E669 Obesity, unspecified: Secondary | ICD-10-CM

## 2017-11-06 DIAGNOSIS — E8881 Metabolic syndrome: Secondary | ICD-10-CM | POA: Diagnosis not present

## 2017-11-06 NOTE — Progress Notes (Signed)
Office: [phone]  /  Fax: [phone]   HPI:   Chief Complaint: OBESITY Cheryl Friedman is here to discuss her progress with her obesity treatment plan. She is on the  follow the Category 1 plan and is following her eating plan approximately 70 % of the time. She had recent removal of bilateral great toe ingrown nails and is not currently exercising.   Bell is currently struggling with eating all of the food on her plan. She reports eating about half of the meat at dinner. She does take a cooler to work and keeps track of the food she eats.  Her weight is 206 lb (93.4 kg) today and has had a weight loss of 1 pounds over a period of 2 weeks since her last visit. She has lost 0 lbs since starting treatment with [place].  Insulin Resistance Marise has a diagnosis of insulin resistance based on her elevated fasting insulin level 8.0 on 08/28/17. Although Sydny's blood glucose readings are still under good control, insulin resistance puts her at greater risk of metabolic syndrome and diabetes. She is taking metformin currently and continues to work on diet and exercise to decrease risk of diabetes. She had diarrhea and cramping with metformin which subsided when she began taking it at night rather than in the morning. She reports the metformin has eliminated her polyphagia.   Vitamin D deficiency Batsheva has a diagnosis of vitamin D deficiency. She is currently taking vit D and denies nausea, vomiting or muscle weakness.   ALLERGIES: Allergies  Allergen Reactions  . Coconut Oil Swelling    Throat and eye swelling  . Gadolinium Derivatives     Pt got nauseated and vomitted.  Inject slowly next time.  . Latex Swelling    gloves    MEDICATIONS: Current Outpatient Medications on File Prior to Visit  Medication Sig Dispense Refill  . clotrimazole-betamethasone (LOTRISONE) cream clotrimazole-betamethasone 1 %-0.05 % topical cream  APPLY TO AFFECTED AREA 2 TIMES DAILY    . cyclobenzaprine  (FLEXERIL) 10 MG tablet 1 tab po qhs up to TID as needed for back spasms    . HYDROcodone-acetaminophen (NORCO/VICODIN) 5-325 MG tablet hydrocodone 5 mg-acetaminophen 325 mg tablet  TAKE 1 TABLET BY MOUTH EVERY 4 TO 6 HOURS AS NEEDED FOR PAIN    . ibuprofen (ADVIL,MOTRIN) 800 MG tablet Take 1 tablet (800 mg total) by mouth every 6 (six) hours as needed for mild pain. 50 tablet 0  . metFORMIN (GLUCOPHAGE) 500 MG tablet Take 1 tablet (500 mg total) by mouth daily with breakfast. 30 tablet 0  . naproxen sodium (ANAPROX) 550 MG tablet naproxen sodium 550 mg tablet  TAKE 1 TABLET EVERY 12 HOURS AS NEEDED    . norethindrone (MICRONOR,CAMILA,ERRIN) 0.35 MG tablet     . norethindrone-ethinyl estradiol (FEMHRT 1/5) 1-5 MG-MCG TABS tablet Take 1 tablet by mouth daily. (Patient taking differently: Take 1 tablet by mouth at bedtime. ) 28 tablet 0  . Norgestimate-Ethinyl Estradiol Triphasic 0.18/0.215/0.25 MG-35 MCG tablet Take by mouth.    . nystatin cream (MYCOSTATIN)     . predniSONE (STERAPRED UNI-PAK 21 TAB) 10 MG (21) TBPK tablet Sterapred DS 6 days as directed    . triamcinolone cream (KENALOG) 0.1 %     . Vitamin D, Ergocalciferol, (DRISDOL) 50000 units CAPS capsule Take 1 capsule (50,000 Units total) by mouth every 3 (three) days. 10 capsule 0  . Vitamins/Minerals TABS Take by mouth.    . [DISCONTINUED] omeprazole (PRILOSEC) 20 MG capsule Take  1 capsule (20 mg total) by mouth daily. (Patient not taking: Reported on 12/25/2013) 30 capsule 0  . [DISCONTINUED] promethazine (PHENERGAN) 12.5 MG suppository Place 1 suppository (12.5 mg total) rectally every 6 (six) hours as needed for nausea. (Patient not taking: Reported on 12/25/2013) 12 each 0   No current facility-administered medications on file prior to visit.     PAST MEDICAL HISTORY: Past Medical History:  Diagnosis Date  . Back pain   . Dyspnea   . Endometriosis   . GERD (gastroesophageal reflux disease)   . Joint pain   . Leg pain   .  PONV (postoperative nausea and vomiting)     PAST SURGICAL HISTORY: Past Surgical History:  Procedure Laterality Date  . COLPOSCOPY  08/2012  . DILATION AND CURETTAGE OF UTERUS N/A 07/14/2016   Procedure: DILATATION AND CURETTAGE;  Surgeon: Servando Salina, MD;  Location: [facility];  Service: Gynecology;  Laterality: N/A;  . LAPAROSCOPY N/A 07/14/2016   Procedure: LAPAROSCOPY DIAGNOSTIC;  Surgeon: Servando Salina, MD;  Location: [facility];  Service: Gynecology;  Laterality: N/A;  . OVARIAN CYST SURGERY  2003    SOCIAL HISTORY: Social History   Tobacco Use  . Smoking status: Current Some Day Smoker    Packs/day: 0.10    Types: Cigars  . Smokeless tobacco: Never Used  . Tobacco comment: SOCIALLY SMOKES BLACK AND MILD  Substance Use Topics  . Alcohol use: No  . Drug use: No    FAMILY HISTORY: Family History  Problem Relation Age of Onset  . Hypertension Maternal Aunt   . Hypertension Maternal Grandmother   . Hypertension Maternal Aunt   . Hyperlipidemia Mother   . Hypertension Mother   . Cancer Mother     ROS: Review of Systems  Constitutional: Positive for weight loss.  Gastrointestinal: Negative for diarrhea, nausea and vomiting.  Musculoskeletal:       Positive bilateral foot pain-recent removal of bilateral great toe ingrown toenails  Neurological: Negative for weakness.  Endo/Heme/Allergies:       Polyphagia decreased.    PHYSICAL EXAM: Blood pressure 108/71, pulse 92, temperature 98.9 F (37.2 C), temperature source Oral, height 5\' 3"  (1.6 m), weight 206 lb (93.4 kg), last menstrual period 10/27/2017, SpO2 98 %. Body mass index is 36.49 kg/m. Physical Exam  Constitutional: She is oriented to person, place, and time. She appears well-developed and well-nourished.  Cardiovascular: Normal rate.  Pulmonary/Chest: Effort normal.  Musculoskeletal: Normal range of motion.  Neurological: She is alert and oriented to person, place, and time.  Skin: Skin is warm  and dry.  Psychiatric: She has a normal mood and affect. Her behavior is normal.  Vitals reviewed.   RECENT LABS AND TESTS: BMET    Component Value Date/Time   NA 138 08/28/2017 1024   K 3.9 08/28/2017 1024   CL 103 08/28/2017 1024   CO2 21 08/28/2017 1024   GLUCOSE 70 08/28/2017 1024   GLUCOSE 93 01/19/2014 1930   BUN 9 08/28/2017 1024   CREATININE 0.85 08/28/2017 1024   CALCIUM 9.4 08/28/2017 1024   GFRNONAA 88 08/28/2017 1024   GFRAA 102 08/28/2017 1024   Lab Results  Component Value Date   HGBA1C 5.4 08/28/2017   Lab Results  Component Value Date   INSULIN 8.0 08/28/2017   CBC    Component Value Date/Time   WBC 6.0 08/28/2017 1024   WBC 6.8 07/12/2016 1005   RBC 4.69 08/28/2017 1024   RBC 4.68 07/12/2016 1005   HGB  13.9 08/28/2017 1024   HCT 40.8 08/28/2017 1024   PLT 282 07/12/2016 1005   MCV 87 08/28/2017 1024   MCH 29.6 08/28/2017 1024   MCH 29.9 07/12/2016 1005   MCHC 34.1 08/28/2017 1024   MCHC 32.6 07/12/2016 1005   RDW 12.8 08/28/2017 1024   LYMPHSABS 1.4 08/28/2017 1024   MONOABS 0.6 01/19/2014 1930   EOSABS 0.1 08/28/2017 1024   BASOSABS 0.0 08/28/2017 1024   Iron/TIBC/Ferritin/ %Sat No results found for: IRON, TIBC, FERRITIN, IRONPCTSAT Lipid Panel     Component Value Date/Time   CHOL 150 08/28/2017 1024   TRIG 63 08/28/2017 1024   HDL 49 08/28/2017 1024   LDLCALC 88 08/28/2017 1024   Hepatic Function Panel     Component Value Date/Time   PROT 7.1 08/28/2017 1024   ALBUMIN 4.3 08/28/2017 1024   AST 16 08/28/2017 1024   ALT 12 08/28/2017 1024   ALKPHOS 65 08/28/2017 1024   BILITOT 0.7 08/28/2017 1024   BILIDIR 0.1 12/09/2011 2320   IBILI 0.5 12/09/2011 2320      Component Value Date/Time   TSH 0.898 08/28/2017 1024    ASSESSMENT AND PLAN: Insulin resistance  Vitamin D deficiency  Class 2 obesity without serious comorbidity with body mass index (BMI) of 36.0 to 36.9 in adult, unspecified obesity type  PLAN:  Insulin  Resistance Abbie will continue to work on weight loss, exercise, and decreasing simple carbohydrates in her diet to help decrease the risk of diabetes. We dicussed metformin including benefits and risks. She was informed that eating too many simple carbohydrates or too many calories at one sitting increases the likelihood of GI side effects. Lillyauna will continue metformin for now and prescription was not written today. She will continue to take the metformin at night for now but we discussed trying to take it again in morning.  Alis agreed to follow up with [place] as directed to monitor her progress.  Vitamin D Deficiency Ruta was informed that low vitamin D levels contributes to fatigue and are associated with obesity, breast, and colon cancer. She agrees to continue to take prescription Vit D @50 ,000 IU every week and will follow up for routine testing of vitamin D, at least 2-3 times per year. She did not need a refill today.  She was informed of the risk of over-replacement of vitamin D and agrees to not increase her dose unless she discusses this with [place] first.  Obesity Kendrah is currently in the action stage of change. As such, her goal is to continue with weight loss efforts She has agreed to follow the Category 1 plan Dariana has been instructed to work up to a goal of 150 minutes of combined cardio and strengthening exercise per week for weight loss and overall health benefits. We discussed the following Behavioral Modification Stratagies today: increasing lean protein intake and work on meal planning and easy cooking plans. We also discussed moving protein from dinner to snack time to assist with eating all of her protein.    Oluwatoni has agreed to follow up with our clinic in 2 weeks. She was informed of the importance of frequent follow up visits to maximize her success with intensive lifestyle modifications for her multiple health conditions.   OBESITY BEHAVIORAL  INTERVENTION VISIT  Today's visit was # 5   Starting weight: 206 lbs Starting date: 08/28/17 Today's weight /: Weight: 206 lb (93.4 kg)  Today's date: 11/06/2017 Total lbs lost to date: 0    ASK:  We discussed the diagnosis of obesity with Mildred Cicio today and Daysia agreed to give [place] permission to discuss obesity behavioral modification therapy today.  ASSESS: Hollace has the diagnosis of obesity and her BMI today is 36.5. Eliya is in the action stage of change   ADVISE: Ralphine was educated on the multiple health risks of obesity as well as the benefit of weight loss to improve her health. She was advised of the need for long term treatment and the importance of lifestyle modifications to improve her current health and to decrease her risk of future health problems.  AGREE: Multiple dietary modification options and treatment options were discussed and  Tiamarie agreed to follow the recommendations documented in the above note.  ARRANGE: Nixie was educated on the importance of frequent visits to treat obesity as outlined per CMS and USPSTF guidelines and agreed to schedule her next follow up appointment today.  I spent > than 50% of the 15 minute visit on counseling as documented in the note.

## 2017-11-14 ENCOUNTER — Other Ambulatory Visit: Payer: 59

## 2017-11-20 ENCOUNTER — Ambulatory Visit (INDEPENDENT_AMBULATORY_CARE_PROVIDER_SITE_OTHER): Payer: 59 | Admitting: Family Medicine

## 2017-11-20 ENCOUNTER — Encounter (INDEPENDENT_AMBULATORY_CARE_PROVIDER_SITE_OTHER): Payer: Self-pay

## 2017-11-21 ENCOUNTER — Encounter (INDEPENDENT_AMBULATORY_CARE_PROVIDER_SITE_OTHER): Payer: Self-pay

## 2017-11-21 ENCOUNTER — Ambulatory Visit: Payer: Self-pay

## 2017-11-21 ENCOUNTER — Ambulatory Visit (INDEPENDENT_AMBULATORY_CARE_PROVIDER_SITE_OTHER): Payer: 59 | Admitting: Family Medicine

## 2017-11-21 DIAGNOSIS — L600 Ingrowing nail: Secondary | ICD-10-CM

## 2017-11-21 NOTE — Patient Instructions (Signed)

## 2017-11-27 NOTE — Progress Notes (Signed)
Patient is here today for follow-up appointment, recent procedure performed on 10/31/2017, removal of ingrown toenails medial margins bilateral big toes.  She states that she is not having any pain or complications at this time with her toes.   no erythema, no redness, no swelling, no drainage, no other signs and symptoms of infection.  Area is scabbing over and appears to be healing well.  Signs and symptoms of infection were discussed with patient.  Verbal and written instructions were given to the patient.  She is to follow-up with any acute symptom changes.

## 2017-12-20 ENCOUNTER — Ambulatory Visit (INDEPENDENT_AMBULATORY_CARE_PROVIDER_SITE_OTHER): Payer: 59 | Admitting: Bariatrics

## 2017-12-20 ENCOUNTER — Encounter (INDEPENDENT_AMBULATORY_CARE_PROVIDER_SITE_OTHER): Payer: Self-pay | Admitting: Bariatrics

## 2017-12-20 ENCOUNTER — Ambulatory Visit (INDEPENDENT_AMBULATORY_CARE_PROVIDER_SITE_OTHER): Payer: 59 | Admitting: Physician Assistant

## 2017-12-20 VITALS — BP 100/60 | HR 69 | Temp 98.4°F | Ht 63.0 in | Wt 200.0 lb

## 2017-12-20 DIAGNOSIS — E66812 Obesity, class 2: Secondary | ICD-10-CM

## 2017-12-20 DIAGNOSIS — B353 Tinea pedis: Secondary | ICD-10-CM | POA: Diagnosis not present

## 2017-12-20 DIAGNOSIS — Z9189 Other specified personal risk factors, not elsewhere classified: Secondary | ICD-10-CM | POA: Diagnosis not present

## 2017-12-20 DIAGNOSIS — E88819 Insulin resistance, unspecified: Secondary | ICD-10-CM

## 2017-12-20 DIAGNOSIS — E8881 Metabolic syndrome: Secondary | ICD-10-CM | POA: Diagnosis not present

## 2017-12-20 DIAGNOSIS — E559 Vitamin D deficiency, unspecified: Secondary | ICD-10-CM

## 2017-12-20 DIAGNOSIS — Z6835 Body mass index (BMI) 35.0-35.9, adult: Secondary | ICD-10-CM

## 2017-12-20 MED ORDER — METFORMIN HCL 500 MG PO TABS: 500.0000 mg | ORAL_TABLET | Freq: Every day | ORAL | 0 refills | Status: DC

## 2017-12-21 LAB — COMPREHENSIVE METABOLIC PANEL
ALBUMIN: 4.4 g/dL (ref 3.5–5.5)
ALT: 10 IU/L (ref 0–32)
AST: 15 IU/L (ref 0–40)
Albumin/Globulin Ratio: 1.7 (ref 1.2–2.2)
Alkaline Phosphatase: 61 IU/L (ref 39–117)
BUN / CREAT RATIO: 15 (ref 9–23)
BUN: 12 mg/dL (ref 6–20)
Bilirubin Total: 0.9 mg/dL (ref 0.0–1.2)
CALCIUM: 9.4 mg/dL (ref 8.7–10.2)
CO2: 23 mmol/L (ref 20–29)
CREATININE: 0.8 mg/dL (ref 0.57–1.00)
Chloride: 104 mmol/L (ref 96–106)
GFR, EST AFRICAN AMERICAN: 110 mL/min/{1.73_m2} (ref >59)
GFR, EST NON AFRICAN AMERICAN: 95 mL/min/{1.73_m2} (ref >59)
GLOBULIN, TOTAL: 2.6 g/dL (ref 1.5–4.5)
Glucose: 77 mg/dL (ref 65–99)
Potassium: 4.5 mmol/L (ref 3.5–5.2)
SODIUM: 140 mmol/L (ref 134–144)
Total Protein: 7 g/dL (ref 6.0–8.5)

## 2017-12-21 LAB — VITAMIN D 25 HYDROXY (VIT D DEFICIENCY, FRACTURES): Vit D, 25-Hydroxy: 20.2 ng/mL — ABNORMAL LOW (ref 30.0–100.0)

## 2017-12-21 LAB — INSULIN, RANDOM: INSULIN: 7.8 u[IU]/mL (ref 2.6–24.9)

## 2017-12-21 LAB — HEMOGLOBIN A1C
Est. average glucose Bld gHb Est-mCnc: 108 mg/dL
Hgb A1c MFr Bld: 5.4 % (ref 4.8–5.6)

## 2017-12-25 DIAGNOSIS — E8881 Metabolic syndrome: Secondary | ICD-10-CM | POA: Insufficient documentation

## 2017-12-25 DIAGNOSIS — Z6836 Body mass index (BMI) 36.0-36.9, adult: Secondary | ICD-10-CM | POA: Insufficient documentation

## 2017-12-25 NOTE — Progress Notes (Signed)
Office: [phone]  /  Fax: [phone]   HPI:   Chief Complaint: OBESITY Cheryl Friedman is here to discuss her progress with her obesity treatment plan. She is on the Category 1 plan and is following her eating plan approximately 50 % of the time. She states she is walking 30 minutes 3 times per week. Cheryl Friedman is doing well with the Category 1 plan. She had a good holiday (eating more seafood, increasing H2O intake). Cheryl Friedman is not struggling with hunger. Her weight is 200 lb (90.7 kg) today and has had a weight loss of 6 pounds over a period of 6 weeks since her last visit. She has lost 6 lbs since starting treatment with [place].  Insulin Resistance Cheryl Friedman has a diagnosis of insulin resistance based on her elevated fasting insulin level of 8.0. Although Cheryl Friedman's blood glucose readings are still under good control, insulin resistance puts her at greater risk of metabolic syndrome and diabetes. She is taking metformin currently and continues to work on diet and exercise to decrease risk of diabetes. Cheryl Friedman denies polyphagia.  At risk for diabetes Cheryl Friedman is at higher than average risk for developing diabetes due to her obesity and insulin resistance. She currently denies polyuria or polydipsia.  Vitamin D deficiency Cheryl Friedman has a diagnosis of vitamin D deficiency. She is currently taking high dose prescription vit D and denies nausea, vomiting or muscle weakness.  Tinea Pedis Cheryl Friedman has athlete's foot and she admits to itching.  ALLERGIES: Allergies  Allergen Reactions  . Coconut Oil Swelling    Throat and eye swelling  . Gadolinium Derivatives     Pt got nauseated and vomitted.  Inject slowly next time.  . Latex Swelling    gloves    MEDICATIONS: Current Outpatient Medications on File Prior to Visit  Medication Sig Dispense Refill  . clotrimazole-betamethasone (LOTRISONE) cream clotrimazole-betamethasone 1 %-0.05 % topical cream  APPLY TO AFFECTED AREA 2 TIMES DAILY      . cyclobenzaprine (FLEXERIL) 10 MG tablet 1 tab po qhs up to TID as needed for back spasms    . HYDROcodone-acetaminophen (NORCO/VICODIN) 5-325 MG tablet hydrocodone 5 mg-acetaminophen 325 mg tablet  TAKE 1 TABLET BY MOUTH EVERY 4 TO 6 HOURS AS NEEDED FOR PAIN    . ibuprofen (ADVIL,MOTRIN) 800 MG tablet Take 1 tablet (800 mg total) by mouth every 6 (six) hours as needed for mild pain. 50 tablet 0  . naproxen sodium (ANAPROX) 550 MG tablet naproxen sodium 550 mg tablet  TAKE 1 TABLET EVERY 12 HOURS AS NEEDED    . norethindrone (MICRONOR,CAMILA,ERRIN) 0.35 MG tablet     . norethindrone-ethinyl estradiol (FEMHRT 1/5) 1-5 MG-MCG TABS tablet Take 1 tablet by mouth daily. (Patient taking differently: Take 1 tablet by mouth at bedtime. ) 28 tablet 0  . Norgestimate-Ethinyl Estradiol Triphasic 0.18/0.215/0.25 MG-35 MCG tablet Take by mouth.    . nystatin cream (MYCOSTATIN)     . triamcinolone cream (KENALOG) 0.1 %     . Vitamin D, Ergocalciferol, (DRISDOL) 50000 units CAPS capsule Take 1 capsule (50,000 Units total) by mouth every 3 (three) days. 10 capsule 0  . Vitamins/Minerals TABS Take by mouth.    . [DISCONTINUED] omeprazole (PRILOSEC) 20 MG capsule Take 1 capsule (20 mg total) by mouth daily. (Patient not taking: Reported on 12/25/2013) 30 capsule 0  . [DISCONTINUED] promethazine (PHENERGAN) 12.5 MG suppository Place 1 suppository (12.5 mg total) rectally every 6 (six) hours as needed for nausea. (Patient not taking: Reported on 12/25/2013) 12 each  0   No current facility-administered medications on file prior to visit.     PAST MEDICAL HISTORY: Past Medical History:  Diagnosis Date  . Back pain   . Dyspnea   . Endometriosis   . GERD (gastroesophageal reflux disease)   . Joint pain   . Leg pain   . PONV (postoperative nausea and vomiting)     PAST SURGICAL HISTORY: Past Surgical History:  Procedure Laterality Date  . COLPOSCOPY  08/2012  . DILATION AND CURETTAGE OF UTERUS N/A  07/14/2016   Procedure: DILATATION AND CURETTAGE;  Surgeon: Servando Salina, MD;  Location: [facility];  Service: Gynecology;  Laterality: N/A;  . LAPAROSCOPY N/A 07/14/2016   Procedure: LAPAROSCOPY DIAGNOSTIC;  Surgeon: Servando Salina, MD;  Location: [facility];  Service: Gynecology;  Laterality: N/A;  . OVARIAN CYST SURGERY  2003    SOCIAL HISTORY: Social History   Tobacco Use  . Smoking status: Current Some Day Smoker    Packs/day: 0.10    Types: Cigars  . Smokeless tobacco: Never Used  . Tobacco comment: SOCIALLY SMOKES BLACK AND MILD  Substance Use Topics  . Alcohol use: No  . Drug use: No    FAMILY HISTORY: Family History  Problem Relation Age of Onset  . Hypertension Maternal Aunt   . Hypertension Maternal Grandmother   . Hypertension Maternal Aunt   . Hyperlipidemia Mother   . Hypertension Mother   . Cancer Mother     ROS: Review of Systems  Constitutional: Positive for weight loss.  Gastrointestinal: Negative for nausea and vomiting.  Genitourinary: Negative for frequency.  Musculoskeletal:       Negative for muscle weakness  Skin: Positive for itching.  Endo/Heme/Allergies: Negative for polydipsia.       Negative for polyphagia    PHYSICAL EXAM: Blood pressure 100/60, pulse 69, temperature 98.4 F (36.9 C), temperature source Oral, height 5\' 3"  (1.6 m), weight 200 lb (90.7 kg), SpO2 100 %. Body mass index is 35.43 kg/m. Physical Exam  Constitutional: She is oriented to person, place, and time. She appears well-developed and well-nourished.  Cardiovascular: Normal rate.  Pulmonary/Chest: Effort normal.  Musculoskeletal: Normal range of motion.  Neurological: She is oriented to person, place, and time.  Skin: Skin is warm and dry.  Psychiatric: She has a normal mood and affect. Her behavior is normal.  Vitals reviewed.   RECENT LABS AND TESTS: BMET    Component Value Date/Time   NA 140 12/20/2017 1212   K 4.5 12/20/2017 1212   CL 104 12/20/2017  1212   CO2 23 12/20/2017 1212   GLUCOSE 77 12/20/2017 1212   GLUCOSE 93 01/19/2014 1930   BUN 12 12/20/2017 1212   CREATININE 0.80 12/20/2017 1212   CALCIUM 9.4 12/20/2017 1212   GFRNONAA 95 12/20/2017 1212   GFRAA 110 12/20/2017 1212   Lab Results  Component Value Date   HGBA1C 5.4 12/20/2017   HGBA1C 5.4 08/28/2017   Lab Results  Component Value Date   INSULIN 7.8 12/20/2017   INSULIN 8.0 08/28/2017   CBC    Component Value Date/Time   WBC 6.0 08/28/2017 1024   WBC 6.8 07/12/2016 1005   RBC 4.69 08/28/2017 1024   RBC 4.68 07/12/2016 1005   HGB 13.9 08/28/2017 1024   HCT 40.8 08/28/2017 1024   PLT 282 07/12/2016 1005   MCV 87 08/28/2017 1024   MCH 29.6 08/28/2017 1024   MCH 29.9 07/12/2016 1005   MCHC 34.1 08/28/2017 1024   MCHC 32.6  07/12/2016 1005   RDW 12.8 08/28/2017 1024   LYMPHSABS 1.4 08/28/2017 1024   MONOABS 0.6 01/19/2014 1930   EOSABS 0.1 08/28/2017 1024   BASOSABS 0.0 08/28/2017 1024   Iron/TIBC/Ferritin/ %Sat No results found for: IRON, TIBC, FERRITIN, IRONPCTSAT Lipid Panel     Component Value Date/Time   CHOL 150 08/28/2017 1024   TRIG 63 08/28/2017 1024   HDL 49 08/28/2017 1024   LDLCALC 88 08/28/2017 1024   Hepatic Function Panel     Component Value Date/Time   PROT 7.0 12/20/2017 1212   ALBUMIN 4.4 12/20/2017 1212   AST 15 12/20/2017 1212   ALT 10 12/20/2017 1212   ALKPHOS 61 12/20/2017 1212   BILITOT 0.9 12/20/2017 1212   BILIDIR 0.1 12/09/2011 2320   IBILI 0.5 12/09/2011 2320      Component Value Date/Time   TSH 0.898 08/28/2017 1024   Results for RENEISHA, STILLEY (MRN [id number]) as of 12/25/2017 08:03  Ref. Range 12/20/2017 12:12  Vitamin D, 25-Hydroxy Latest Ref Range: 30.0 - 100.0 ng/mL 20.2 (L)   ASSESSMENT AND PLAN: Insulin resistance - Plan: Hemoglobin A1c, Insulin, random, Comprehensive metabolic panel, metFORMIN (GLUCOPHAGE) 500 MG tablet  Vitamin D deficiency - Plan: VITAMIN D 25 Hydroxy (Vit-D Deficiency,  Fractures)  Tinea pedis, unspecified laterality  At risk for diabetes mellitus  Class 2 severe obesity with serious comorbidity and body mass index (BMI) of 35.0 to 35.9 in adult, unspecified obesity type (HCC)  PLAN:  Insulin Resistance Cheryl Friedman will continue to work on weight loss, exercise, and decreasing simple carbohydrates in her diet to help decrease the risk of diabetes. We dicussed metformin including benefits and risks. She was informed that eating too many simple carbohydrates or too many calories at one sitting increases the likelihood of GI side effects. Cheryl Friedman agreed to continue metformin 500 mg qd #30 with no refills and we will check Hgb A1c and insulin level today. Cheryl Friedman agreed to follow up with [place] as directed to monitor her progress.  Diabetes risk counseling Cheryl Friedman was given extended (15 minutes) diabetes prevention counseling today. She is 37 y.o. female and has risk factors for diabetes including obesity and insulin resistance. We discussed intensive lifestyle modifications today with an emphasis on weight loss as well as increasing exercise and decreasing simple carbohydrates in her diet.  Vitamin D Deficiency Cheryl Friedman was informed that low vitamin D levels contributes to fatigue and are associated with obesity, breast, and colon cancer. She agrees to continue to take prescription Vit D @50 ,000 IU every week and will follow up for routine testing of vitamin D, at least 2-3 times per year. She was informed of the risk of over-replacement of vitamin D and agrees to not increase her dose unless she discusses this with [place] first. We will check vitamin D level and Cheryl Friedman will follow up at the agreed upon time.  Tinea Pedis Cheryl Friedman will use OTC medications for "athletes foot" and she will follow up with our clinic in 2 weeks.  Obesity Cheryl Friedman is currently in the action stage of change. As such, her goal is to continue with weight loss efforts She has agreed to  follow the Category 1 plan Cheryl Friedman has been instructed to work up to a goal of 150 minutes of combined cardio and strengthening exercise per week for weight loss and overall health benefits. We discussed the following Behavioral Modification Strategies today: increase H2O intake, no skipping meals, keeping healthy foods in the home, increasing lean protein intake, decreasing simple carbohydrates ,  increasing vegetables and work on more meal planning and easy cooking plans Cheryl Friedman will continue to spread her protein out, throughout the day.  Cheryl Friedman has agreed to follow up with our clinic in 2 weeks. She was informed of the importance of frequent follow up visits to maximize her success with intensive lifestyle modifications for her multiple health conditions.   OBESITY BEHAVIORAL INTERVENTION VISIT  Today's visit was # 6   Starting weight: 206 lbs Starting date: 08/28/2017 Today's weight : 200 lbs  Today's date: 12/20/2017 Total lbs lost to date: 6   ASK: We discussed the diagnosis of obesity with Cheryl Friedman today and Cheryl Friedman agreed to give [place] permission to discuss obesity behavioral modification therapy today.  ASSESS: Cheryl Friedman has the diagnosis of obesity and her BMI today is 35.44 Cheryl Friedman is in the action stage of change   ADVISE: Cheryl Friedman was educated on the multiple health risks of obesity as well as the benefit of weight loss to improve her health. She was advised of the need for long term treatment and the importance of lifestyle modifications to improve her current health and to decrease her risk of future health problems.  AGREE: Multiple dietary modification options and treatment options were discussed and  Avyana agreed to follow the recommendations documented in the above note.  ARRANGE: Smrithi was educated on the importance of frequent visits to treat obesity as outlined per CMS and USPSTF guidelines and agreed to schedule her next follow up appointment  today.  Corey Skains, am acting as Location manager for [employer]. Owens Shark, DO  I have reviewed the above documentation for accuracy and completeness, and I agree with the above. -Jearld Lesch, DO

## 2018-01-03 ENCOUNTER — Encounter (INDEPENDENT_AMBULATORY_CARE_PROVIDER_SITE_OTHER): Payer: Self-pay | Admitting: Bariatrics

## 2018-01-03 ENCOUNTER — Ambulatory Visit (INDEPENDENT_AMBULATORY_CARE_PROVIDER_SITE_OTHER): Payer: 59 | Admitting: Bariatrics

## 2018-01-03 VITALS — BP 109/73 | HR 73 | Temp 99.0°F | Ht 63.0 in | Wt 204.0 lb

## 2018-01-03 DIAGNOSIS — E8881 Metabolic syndrome: Secondary | ICD-10-CM | POA: Diagnosis not present

## 2018-01-03 DIAGNOSIS — Z6836 Body mass index (BMI) 36.0-36.9, adult: Secondary | ICD-10-CM

## 2018-01-03 DIAGNOSIS — Z9189 Other specified personal risk factors, not elsewhere classified: Secondary | ICD-10-CM

## 2018-01-03 DIAGNOSIS — N946 Dysmenorrhea, unspecified: Secondary | ICD-10-CM | POA: Diagnosis not present

## 2018-01-03 DIAGNOSIS — E559 Vitamin D deficiency, unspecified: Secondary | ICD-10-CM | POA: Diagnosis not present

## 2018-01-03 NOTE — Progress Notes (Signed)
Office: [phone]  /  Fax: [phone]   HPI:   Chief Complaint: OBESITY Cheryl Friedman is here to discuss her progress with her obesity treatment plan. She is on the Category 1 plan and is following her eating plan approximately 75 % of the time. She states she is exercising 30 minutes 3 times per week. Cheryl Friedman has been struggling with her plan somewhat. She thinks that she is retaining water. Cheryl Friedman has been very busy. Her weight is 204 lb (92.5 kg) today and has had a weight gain of 4 pounds over a period of 2 weeks since her last visit. She has lost 2 lbs since starting treatment with [place].  Insulin Resistance Cheryl Friedman has a diagnosis of insulin resistance based on her elevated fasting insulin level >5. Although Cheryl Friedman's blood glucose readings are still under good control, insulin resistance puts her at greater risk of metabolic syndrome and diabetes. She is taking metformin currently and continues to work on diet and exercise to decrease risk of diabetes.  At risk for diabetes Cheryl Friedman is at higher than average risk for developing diabetes due to her obesity and insulin resistance. She currently denies polyuria or polydipsia.  Vitamin D deficiency Cheryl Friedman has a diagnosis of vitamin D deficiency. Her last vitamin D level was at 20.2 She is currently taking high dose prescription vit D and denies nausea, vomiting or muscle weakness.  Dysmenorrhea Cheryl Friedman has long term dysmenorrhea and she is taking birth control pills. She has increased water weight of 3 pounds.  ASSESSMENT AND PLAN:  Insulin resistance  Vitamin D deficiency  Dysmenorrhea  At risk for diabetes mellitus  Class 2 severe obesity with serious comorbidity and body mass index (BMI) of 36.0 to 36.9 in adult, unspecified obesity type ([place])  PLAN:  Insulin Resistance Cheryl Friedman will continue to work on weight loss, exercise, and decreasing simple carbohydrates in her diet to help decrease the risk of diabetes. We  dicussed metformin including benefits and risks. She was informed that eating too many simple carbohydrates or too many calories at one sitting increases the likelihood of GI side effects. Laporche will continue metformin for now and prescription was not written today. Cheryl Friedman agreed to follow up with [place] as directed to monitor her progress.  Diabetes risk counseling Cheryl Friedman was given extended (15 minutes) diabetes prevention counseling today. She is 37 y.o. female and has risk factors for diabetes including obesity and insulin resistance. We discussed intensive lifestyle modifications today with an emphasis on weight loss as well as increasing exercise and decreasing simple carbohydrates in her diet.  Vitamin D Deficiency Cheryl Friedman was informed that low vitamin D levels contributes to fatigue and are associated with obesity, breast, and colon cancer. She agrees to continue to take prescription Vit D @50 ,000 IU every week and will follow up for routine testing of vitamin D, at least 2-3 times per year. She was informed of the risk of over-replacement of vitamin D and agrees to not increase her dose unless she discusses this with [place] first.  Dysmenorrhea Cheryl Friedman will take ibuprofen and she will use a heating pad. Cheryl Friedman will follow up with our clinic in 2 weeks.  Obesity Cheryl Friedman is currently in the action stage of change. As such, her goal is to continue with weight loss efforts She has agreed to follow the Category 1 plan Cheryl Friedman has been instructed to work up to a goal of 150 minutes of combined cardio and strengthening exercise per week for weight loss and overall health benefits. We  discussed the following Behavioral Modification Strategies today: increase H2O intake, no skipping meals, increasing lean protein intake, decreasing simple carbohydrates , increasing vegetables, decrease eating out and work on meal planning and easy cooking plans  Cheryl Friedman will pack her lunch for work and she will  take snacks for work. Cheryl Friedman may use microwave meals.   Cheryl Friedman has agreed to follow up with our clinic in 2 weeks. She was informed of the importance of frequent follow up visits to maximize her success with intensive lifestyle modifications for her multiple health conditions.  ALLERGIES: Allergies  Allergen Reactions  . Coconut Oil Swelling    Throat and eye swelling  . Gadolinium Derivatives     Pt got nauseated and vomitted.  Inject slowly next time.  . Latex Swelling    gloves    MEDICATIONS: Current Outpatient Medications on File Prior to Visit  Medication Sig Dispense Refill  . clotrimazole-betamethasone (LOTRISONE) cream clotrimazole-betamethasone 1 %-0.05 % topical cream  APPLY TO AFFECTED AREA 2 TIMES DAILY    . cyclobenzaprine (FLEXERIL) 10 MG tablet 1 tab po qhs up to TID as needed for back spasms    . HYDROcodone-acetaminophen (NORCO/VICODIN) 5-325 MG tablet hydrocodone 5 mg-acetaminophen 325 mg tablet  TAKE 1 TABLET BY MOUTH EVERY 4 TO 6 HOURS AS NEEDED FOR PAIN    . ibuprofen (ADVIL,MOTRIN) 800 MG tablet Take 1 tablet (800 mg total) by mouth every 6 (six) hours as needed for mild pain. 50 tablet 0  . metFORMIN (GLUCOPHAGE) 500 MG tablet Take 1 tablet (500 mg total) by mouth daily with breakfast. 30 tablet 0  . naproxen sodium (ANAPROX) 550 MG tablet naproxen sodium 550 mg tablet  TAKE 1 TABLET EVERY 12 HOURS AS NEEDED    . norethindrone (MICRONOR,CAMILA,ERRIN) 0.35 MG tablet     . norethindrone-ethinyl estradiol (FEMHRT 1/5) 1-5 MG-MCG TABS tablet Take 1 tablet by mouth daily. (Patient taking differently: Take 1 tablet by mouth at bedtime. ) 28 tablet 0  . Norgestimate-Ethinyl Estradiol Triphasic 0.18/0.215/0.25 MG-35 MCG tablet Take by mouth.    . nystatin cream (MYCOSTATIN)     . triamcinolone cream (KENALOG) 0.1 %     . Vitamin D, Ergocalciferol, (DRISDOL) 50000 units CAPS capsule Take 1 capsule (50,000 Units total) by mouth every 3 (three) days. 10 capsule 0  .  Vitamins/Minerals TABS Take by mouth.    . [DISCONTINUED] omeprazole (PRILOSEC) 20 MG capsule Take 1 capsule (20 mg total) by mouth daily. (Patient not taking: Reported on 12/25/2013) 30 capsule 0  . [DISCONTINUED] promethazine (PHENERGAN) 12.5 MG suppository Place 1 suppository (12.5 mg total) rectally every 6 (six) hours as needed for nausea. (Patient not taking: Reported on 12/25/2013) 12 each 0   No current facility-administered medications on file prior to visit.     PAST MEDICAL HISTORY: Past Medical History:  Diagnosis Date  . Back pain   . Dyspnea   . Endometriosis   . GERD (gastroesophageal reflux disease)   . Joint pain   . Leg pain   . PONV (postoperative nausea and vomiting)     PAST SURGICAL HISTORY: Past Surgical History:  Procedure Laterality Date  . COLPOSCOPY  08/2012  . DILATION AND CURETTAGE OF UTERUS N/A 07/14/2016   Procedure: DILATATION AND CURETTAGE;  Surgeon: Servando Salina, MD;  Location: [facility];  Service: Gynecology;  Laterality: N/A;  . LAPAROSCOPY N/A 07/14/2016   Procedure: LAPAROSCOPY DIAGNOSTIC;  Surgeon: Servando Salina, MD;  Location: [facility];  Service: Gynecology;  Laterality: N/A;  .  OVARIAN CYST SURGERY  2003    SOCIAL HISTORY: Social History   Tobacco Use  . Smoking status: Current Some Day Smoker    Packs/day: 0.10    Types: Cigars  . Smokeless tobacco: Never Used  . Tobacco comment: SOCIALLY SMOKES BLACK AND MILD  Substance Use Topics  . Alcohol use: No  . Drug use: No    FAMILY HISTORY: Family History  Problem Relation Age of Onset  . Hypertension Maternal Aunt   . Hypertension Maternal Grandmother   . Hypertension Maternal Aunt   . Hyperlipidemia Mother   . Hypertension Mother   . Cancer Mother     ROS: Review of Systems  Constitutional: Negative for weight loss.  Gastrointestinal: Negative for nausea and vomiting.  Genitourinary: Negative for frequency.  Musculoskeletal:       Negative for muscle weakness    Endo/Heme/Allergies: Negative for polydipsia.    PHYSICAL EXAM: Blood pressure 109/73, pulse 73, temperature 99 F (37.2 C), temperature source Oral, height 5\' 3"  (1.6 m), weight 204 lb (92.5 kg), SpO2 98 %. Body mass index is 36.14 kg/m. Physical Exam Vitals signs reviewed.  Constitutional:      Appearance: Normal appearance. She is well-developed. She is obese.  Cardiovascular:     Rate and Rhythm: Normal rate.  Pulmonary:     Effort: Pulmonary effort is normal.  Musculoskeletal: Normal range of motion.     Right lower leg: No edema.     Left lower leg: No edema.     Comments: No edema was noted  Skin:    General: Skin is warm and dry.  Neurological:     Mental Status: She is alert and oriented to person, place, and time.  Psychiatric:        Mood and Affect: Mood normal.        Behavior: Behavior normal.     RECENT LABS AND TESTS: BMET    Component Value Date/Time   NA 140 12/20/2017 1212   K 4.5 12/20/2017 1212   CL 104 12/20/2017 1212   CO2 23 12/20/2017 1212   GLUCOSE 77 12/20/2017 1212   GLUCOSE 93 01/19/2014 1930   BUN 12 12/20/2017 1212   CREATININE 0.80 12/20/2017 1212   CALCIUM 9.4 12/20/2017 1212   GFRNONAA 95 12/20/2017 1212   GFRAA 110 12/20/2017 1212   Lab Results  Component Value Date   HGBA1C 5.4 12/20/2017   HGBA1C 5.4 08/28/2017   Lab Results  Component Value Date   INSULIN 7.8 12/20/2017   INSULIN 8.0 08/28/2017   CBC    Component Value Date/Time   WBC 6.0 08/28/2017 1024   WBC 6.8 07/12/2016 1005   RBC 4.69 08/28/2017 1024   RBC 4.68 07/12/2016 1005   HGB 13.9 08/28/2017 1024   HCT 40.8 08/28/2017 1024   PLT 282 07/12/2016 1005   MCV 87 08/28/2017 1024   MCH 29.6 08/28/2017 1024   MCH 29.9 07/12/2016 1005   MCHC 34.1 08/28/2017 1024   MCHC 32.6 07/12/2016 1005   RDW 12.8 08/28/2017 1024   LYMPHSABS 1.4 08/28/2017 1024   MONOABS 0.6 01/19/2014 1930   EOSABS 0.1 08/28/2017 1024   BASOSABS 0.0 08/28/2017 1024    Iron/TIBC/Ferritin/ %Sat No results found for: IRON, TIBC, FERRITIN, IRONPCTSAT Lipid Panel     Component Value Date/Time   CHOL 150 08/28/2017 1024   TRIG 63 08/28/2017 1024   HDL 49 08/28/2017 1024   LDLCALC 88 08/28/2017 1024   Hepatic Function Panel  Component Value Date/Time   PROT 7.0 12/20/2017 1212   ALBUMIN 4.4 12/20/2017 1212   AST 15 12/20/2017 1212   ALT 10 12/20/2017 1212   ALKPHOS 61 12/20/2017 1212   BILITOT 0.9 12/20/2017 1212   BILIDIR 0.1 12/09/2011 2320   IBILI 0.5 12/09/2011 2320      Component Value Date/Time   TSH 0.898 08/28/2017 1024    Ref. Range 12/20/2017 12:12  Vitamin D, 25-Hydroxy Latest Ref Range: 30.0 - 100.0 ng/mL 20.2 (L)     OBESITY BEHAVIORAL INTERVENTION VISIT  Today's visit was # 7   Starting weight: 206 lbs Starting date: 08/28/2017 Today's weight : 204 lbs Today's date: 01/03/2018 Total lbs lost to date: 2   ASK: We discussed the diagnosis of obesity with Cheryl Friedman today and Cheryl Friedman agreed to give [place] permission to discuss obesity behavioral modification therapy today.  ASSESS: Cheryl Friedman has the diagnosis of obesity and her BMI today is 36.15 Cheryl Friedman is in the action stage of change   ADVISE: Antonietta was educated on the multiple health risks of obesity as well as the benefit of weight loss to improve her health. She was advised of the need for long term treatment and the importance of lifestyle modifications to improve her current health and to decrease her risk of future health problems.  AGREE: Multiple dietary modification options and treatment options were discussed and  Cheryl Friedman agreed to follow the recommendations documented in the above note.  ARRANGE: Cheryl Friedman was educated on the importance of frequent visits to treat obesity as outlined per CMS and USPSTF guidelines and agreed to schedule her next follow up appointment today.  Corey Skains, am acting as Location manager for [employer]. Owens Shark,  DO  I have reviewed the above documentation for accuracy and completeness, and I agree with the above. -Jearld Lesch, DO

## 2018-01-22 ENCOUNTER — Ambulatory Visit (INDEPENDENT_AMBULATORY_CARE_PROVIDER_SITE_OTHER): Payer: 59 | Admitting: Bariatrics

## 2018-01-22 ENCOUNTER — Encounter (INDEPENDENT_AMBULATORY_CARE_PROVIDER_SITE_OTHER): Payer: Self-pay

## 2018-02-26 DIAGNOSIS — R05 Cough: Secondary | ICD-10-CM | POA: Diagnosis not present

## 2018-02-26 DIAGNOSIS — J0100 Acute maxillary sinusitis, unspecified: Secondary | ICD-10-CM | POA: Diagnosis not present

## 2018-02-26 DIAGNOSIS — B373 Candidiasis of vulva and vagina: Secondary | ICD-10-CM | POA: Diagnosis not present

## 2018-03-29 DIAGNOSIS — L700 Acne vulgaris: Secondary | ICD-10-CM | POA: Diagnosis not present

## 2018-04-09 DIAGNOSIS — L700 Acne vulgaris: Secondary | ICD-10-CM | POA: Diagnosis not present

## 2018-04-09 DIAGNOSIS — Z79899 Other long term (current) drug therapy: Secondary | ICD-10-CM | POA: Diagnosis not present

## 2018-04-17 ENCOUNTER — Other Ambulatory Visit: Payer: Self-pay

## 2018-04-17 ENCOUNTER — Ambulatory Visit (INDEPENDENT_AMBULATORY_CARE_PROVIDER_SITE_OTHER): Payer: 59 | Admitting: Bariatrics

## 2018-04-17 ENCOUNTER — Encounter (INDEPENDENT_AMBULATORY_CARE_PROVIDER_SITE_OTHER): Payer: Self-pay | Admitting: Bariatrics

## 2018-04-17 DIAGNOSIS — E8881 Metabolic syndrome: Secondary | ICD-10-CM | POA: Diagnosis not present

## 2018-04-17 DIAGNOSIS — Z6836 Body mass index (BMI) 36.0-36.9, adult: Secondary | ICD-10-CM

## 2018-04-17 DIAGNOSIS — E559 Vitamin D deficiency, unspecified: Secondary | ICD-10-CM

## 2018-04-17 MED ORDER — METFORMIN HCL 500 MG PO TABS: 500.0000 mg | ORAL_TABLET | Freq: Two times a day (BID) | ORAL | 0 refills | Status: DC

## 2018-04-19 NOTE — Progress Notes (Signed)
Office: [phone]  /  Fax: [phone] TeleHealth Visit:  Cheryl Friedman has verbally consented to this TeleHealth visit today. The patient is located at home, the provider is located at the [facility] office. The participants in this visit include the listed provider and patient and any and all parties involved. The visit was conducted today via FaceTime.  HPI:   Chief Complaint: OBESITY Cheryl Friedman is here to discuss her progress with her obesity treatment plan. She is on the Category 1 plan and is following her eating plan approximately 30 to 40 % of the time. She states she is doing Zumba for 60 minutes 2 times per week and she is walking for 30 to 40 minutes 1 to 2 times per week. Cheryl Friedman  We were unable to weigh the patient today for this TeleHealth visit. She feels as if she has maintained weight since her last visit. She has lost 2 lbs since starting treatment with [place].  Insulin Resistance Cheryl Friedman has a diagnosis of insulin resistance based on her elevated fasting insulin level >5. Although Cheryl Friedman's blood glucose readings are still under good control, insulin resistance puts her at greater risk of metabolic syndrome and diabetes. She is taking metformin currently and continues to work on diet and exercise to decrease risk of diabetes. Cheryl Friedman admits to polyphagia.  Vitamin D deficiency Cheryl Friedman has a diagnosis of vitamin D deficiency. She is taking high dose vit D and denies nausea, vomiting or muscle weakness.  ASSESSMENT AND PLAN:  Insulin resistance - Plan: metFORMIN (GLUCOPHAGE) 500 MG tablet  Vitamin D deficiency  Class 2 severe obesity with serious comorbidity and body mass index (BMI) of 36.0 to 36.9 in adult, unspecified obesity type ([place])  PLAN:  Insulin Resistance Cheryl Friedman will continue to work on weight loss, exercise, and decreasing simple carbohydrates in her diet to help decrease the risk of diabetes. We dicussed metformin including benefits  and risks. She was informed that eating too many simple carbohydrates or too many calories at one sitting increases the likelihood of GI side effects. Cheryl Friedman agrees to change metformin 500 mg to BID #60 with no refills and follow up with [place] as directed to monitor her progress.  Vitamin D Deficiency Cheryl Friedman was informed that low vitamin D levels contributes to fatigue and are associated with obesity, breast, and colon cancer. She will continue to take high dose prescription Vit D @50 ,000 IU every week and will follow up for routine testing of vitamin D, at least 2-3 times per year. She was informed of the risk of over-replacement of vitamin D and agrees to not increase her dose unless she discusses this with [place] first.  Obesity Cheryl Friedman is currently in the action stage of change. As such, her goal is to continue with weight loss efforts She has agreed to follow the Category 1 plan Cheryl Friedman will continue her current exercise regimen for weight loss and overall health benefits. We discussed the following Behavioral Modification Strategies today: increase H2O intake, no skipping meals, keeping healthy foods in the home, increasing lean protein intake, decreasing simple carbohydrates, increasing vegetables, decrease eating out and work on meal planning and easy cooking plans  Cheryl Friedman has agreed to follow up with our clinic in 2 weeks. She was informed of the importance of frequent follow up visits to maximize her success with intensive lifestyle modifications for her multiple health conditions.  ALLERGIES: Allergies  Allergen Reactions  . Coconut Oil Swelling    Throat and eye swelling  .  Gadolinium Derivatives     Pt got nauseated and vomitted.  Inject slowly next time.  . Latex Swelling    gloves    MEDICATIONS: Current Outpatient Medications on File Prior to Visit  Medication Sig Dispense Refill  . ibuprofen (ADVIL,MOTRIN) 800 MG tablet Take 1 tablet (800 mg total) by mouth every 6 (six)  hours as needed for mild pain. 50 tablet 0  . naproxen sodium (ANAPROX) 550 MG tablet naproxen sodium 550 mg tablet  TAKE 1 TABLET EVERY 12 HOURS AS NEEDED    . norethindrone-ethinyl estradiol (FEMHRT 1/5) 1-5 MG-MCG TABS tablet Take 1 tablet by mouth daily. (Patient taking differently: Take 1 tablet by mouth at bedtime. ) 28 tablet 0  . Vitamin D, Ergocalciferol, (DRISDOL) 50000 units CAPS capsule Take 1 capsule (50,000 Units total) by mouth every 3 (three) days. 10 capsule 0  . Vitamins/Minerals TABS Take by mouth.    . [DISCONTINUED] Cheryl (PRILOSEC) 20 MG capsule Take 1 capsule (20 mg total) by mouth daily. (Patient not taking: Reported on 12/25/2013) 30 capsule 0  . [DISCONTINUED] Cheryl (PHENERGAN) 12.5 MG suppository Place 1 suppository (12.5 mg total) rectally every 6 (six) hours as needed for nausea. (Patient not taking: Reported on 12/25/2013) 12 each 0   No current facility-administered medications on file prior to visit.     PAST MEDICAL HISTORY: Past Medical History:  Diagnosis Date  . Back pain   . Dyspnea   . Endometriosis   . GERD (gastroesophageal reflux disease)   . Joint pain   . Leg pain   . PONV (postoperative nausea and vomiting)     PAST SURGICAL HISTORY: Past Surgical History:  Procedure Laterality Date  . COLPOSCOPY  08/2012  . DILATION AND CURETTAGE OF UTERUS N/A 07/14/2016   Procedure: DILATATION AND CURETTAGE;  Surgeon: Servando Salina, MD;  Location: [facility];  Service: Gynecology;  Laterality: N/A;  . LAPAROSCOPY N/A 07/14/2016   Procedure: LAPAROSCOPY DIAGNOSTIC;  Surgeon: Servando Salina, MD;  Location: [facility];  Service: Gynecology;  Laterality: N/A;  . OVARIAN CYST SURGERY  2003    SOCIAL HISTORY: Social History   Tobacco Use  . Smoking status: Current Some Day Smoker    Packs/day: 0.10    Types: Cigars  . Smokeless tobacco: Never Used  . Tobacco comment: SOCIALLY SMOKES BLACK AND MILD  Substance Use Topics  . Alcohol use: No   . Drug use: No    FAMILY HISTORY: Family History  Problem Relation Age of Onset  . Hypertension Maternal Aunt   . Hypertension Maternal Grandmother   . Hypertension Maternal Aunt   . Hyperlipidemia Mother   . Hypertension Mother   . Cancer Mother     ROS: Review of Systems  Constitutional: Negative for weight loss.  Gastrointestinal: Negative for nausea and vomiting.  Musculoskeletal:       Negative for muscle weakness  Endo/Heme/Allergies:       Positive for polyphagia    PHYSICAL EXAM: Pt in no acute distress  RECENT LABS AND TESTS: BMET    Component Value Date/Time   NA 140 12/20/2017 1212   K 4.5 12/20/2017 1212   CL 104 12/20/2017 1212   CO2 23 12/20/2017 1212   GLUCOSE 77 12/20/2017 1212   GLUCOSE 93 01/19/2014 1930   BUN 12 12/20/2017 1212   CREATININE 0.80 12/20/2017 1212   CALCIUM 9.4 12/20/2017 1212   GFRNONAA 95 12/20/2017 1212   GFRAA 110 12/20/2017 1212   Lab Results  Component Value  Date   HGBA1C 5.4 12/20/2017   HGBA1C 5.4 08/28/2017   Lab Results  Component Value Date   INSULIN 7.8 12/20/2017   INSULIN 8.0 08/28/2017   CBC    Component Value Date/Time   WBC 6.0 08/28/2017 1024   WBC 6.8 07/12/2016 1005   RBC 4.69 08/28/2017 1024   RBC 4.68 07/12/2016 1005   HGB 13.9 08/28/2017 1024   HCT 40.8 08/28/2017 1024   PLT 282 07/12/2016 1005   MCV 87 08/28/2017 1024   MCH 29.6 08/28/2017 1024   MCH 29.9 07/12/2016 1005   MCHC 34.1 08/28/2017 1024   MCHC 32.6 07/12/2016 1005   RDW 12.8 08/28/2017 1024   LYMPHSABS 1.4 08/28/2017 1024   MONOABS 0.6 01/19/2014 1930   EOSABS 0.1 08/28/2017 1024   BASOSABS 0.0 08/28/2017 1024   Iron/TIBC/Ferritin/ %Sat No results found for: IRON, TIBC, FERRITIN, IRONPCTSAT Lipid Panel     Component Value Date/Time   CHOL 150 08/28/2017 1024   TRIG 63 08/28/2017 1024   HDL 49 08/28/2017 1024   LDLCALC 88 08/28/2017 1024   Hepatic Function Panel     Component Value Date/Time   PROT 7.0 12/20/2017  1212   ALBUMIN 4.4 12/20/2017 1212   AST 15 12/20/2017 1212   ALT 10 12/20/2017 1212   ALKPHOS 61 12/20/2017 1212   BILITOT 0.9 12/20/2017 1212   BILIDIR 0.1 12/09/2011 2320   IBILI 0.5 12/09/2011 2320      Component Value Date/Time   TSH 0.898 08/28/2017 1024     Ref. Range 12/20/2017 12:12  Vitamin D, 25-Hydroxy Latest Ref Range: 30.0 - 100.0 ng/mL 20.2 (L)     I, Doreene Nest, am acting as Location manager for [employer]. Owens Shark, Cheryl Friedman  I have reviewed the above documentation for accuracy and completeness, and I agree with the above. -Jearld Lesch, Cheryl Friedman

## 2018-05-01 ENCOUNTER — Other Ambulatory Visit: Payer: Self-pay

## 2018-05-01 ENCOUNTER — Encounter (INDEPENDENT_AMBULATORY_CARE_PROVIDER_SITE_OTHER): Payer: Self-pay | Admitting: Bariatrics

## 2018-05-01 ENCOUNTER — Ambulatory Visit (INDEPENDENT_AMBULATORY_CARE_PROVIDER_SITE_OTHER): Payer: 59 | Admitting: Bariatrics

## 2018-05-01 DIAGNOSIS — E559 Vitamin D deficiency, unspecified: Secondary | ICD-10-CM | POA: Diagnosis not present

## 2018-05-01 DIAGNOSIS — E8881 Metabolic syndrome: Secondary | ICD-10-CM

## 2018-05-01 DIAGNOSIS — Z6836 Body mass index (BMI) 36.0-36.9, adult: Secondary | ICD-10-CM | POA: Diagnosis not present

## 2018-05-01 NOTE — Progress Notes (Signed)
Office: [phone]  /  Fax: [phone] TeleHealth Visit:  Cheryl Friedman has verbally consented to this TeleHealth visit today. The patient is located at home, the provider is located at the [employer] [facility] office. The participants in this visit include the listed provider and patient and any and all parties involved. The visit was conducted today via FaceTime.  HPI:   Chief Complaint: OBESITY Cheryl Friedman is here to discuss her progress with her obesity treatment plan. She is on the Category 1 plan and is following her eating plan approximately 50 % of the time. She states she is doing Zumba for 30 minutes 2 times per week. Cheryl Friedman states that her weigh has fluctuated over the last few weeks. She is working from home. Her hunger is well controlled. We were unable to weigh the patient today for this TeleHealth visit. She feels as if she has been up and down in weight since her last visit.  Vitamin D deficiency Cheryl Friedman has a diagnosis of vitamin D deficiency. Cheryl Friedman is taking prescription vit D and she denies nausea, vomiting or muscle weakness.  Insulin Resistance Cheryl Friedman has a diagnosis of insulin resistance based on her elevated fasting insulin level >5. Although Cheryl Friedman's blood glucose readings are still under good control, insulin resistance puts her at greater risk of metabolic syndrome and diabetes. She is taking metformin currently and continues to work on diet and exercise to decrease risk of diabetes. Cheryl Friedman has decreased appetite.  ASSESSMENT AND PLAN:  Vitamin D deficiency  Insulin resistance  Class 2 severe obesity with serious comorbidity and body mass index (BMI) of 36.0 to 36.9 in adult, unspecified obesity type (Cheryl Friedman)  PLAN:  Vitamin D Deficiency Cheryl Friedman was informed that low vitamin D levels contributes to fatigue and are associated with obesity, breast, and colon cancer. She will continue to take prescription Vit D @50 ,000 IU every 3 days and will  follow up for routine testing of vitamin D, at least 2-3 times per year. She was informed of the risk of over-replacement of vitamin D and agrees to not increase her dose unless she discusses this with [place] first.  Insulin Resistance Cheryl Friedman will continue to work on weight loss, exercise, and decreasing simple carbohydrates in her diet to help decrease the risk of diabetes. We dicussed metformin including benefits and risks. She was informed that eating too many simple carbohydrates or too many calories at one sitting increases the likelihood of GI side effects. Cheryl Friedman will continue metformin for now and prescription was not written today. Cheryl Friedman agreed to follow up with [place] as directed to monitor her progress.  Obesity Cheryl Friedman is currently in the action stage of change. As such, her goal is to continue with weight loss efforts She has agreed to follow the Category 1 plan Cheryl Friedman will continue exercise and walking for weight loss and overall health benefits. We discussed the following Behavioral Modification Strategies today: increase H2O intake, no skipping meals, keeping healthy foods in the home, better snacking choices, increasing lean protein intake, decreasing simple carbohydrates, increasing vegetables, decrease eating out, work on meal planning and easy cooking plans, emotional eating strategies, ways to avoid boredom eating and ways to avoid night time snacking Cheryl Friedman will weigh herself at home (increase water).  Cheryl Friedman has agreed to follow up with our clinic in 2 weeks. She was informed of the importance of frequent follow up visits to maximize her success with intensive lifestyle modifications for her multiple health conditions.  ALLERGIES: Allergies  Allergen Reactions  .  Coconut Oil Swelling    Throat and eye swelling  . Gadolinium Derivatives     Pt got nauseated and vomitted.  Inject slowly next time.  . Latex Swelling    gloves    MEDICATIONS: Current Outpatient  Medications on File Prior to Visit  Medication Sig Dispense Refill  . ibuprofen (ADVIL,MOTRIN) 800 MG tablet Take 1 tablet (800 mg total) by mouth every 6 (six) hours as needed for mild pain. 50 tablet 0  . metFORMIN (GLUCOPHAGE) 500 MG tablet Take 1 tablet (500 mg total) by mouth 2 (two) times daily with a meal. 60 tablet 0  . naproxen sodium (ANAPROX) 550 MG tablet naproxen sodium 550 mg tablet  TAKE 1 TABLET EVERY 12 HOURS AS NEEDED    . norethindrone-ethinyl estradiol (FEMHRT 1/5) 1-5 MG-MCG TABS tablet Take 1 tablet by mouth daily. (Patient taking differently: Take 1 tablet by mouth at bedtime. ) 28 tablet 0  . Vitamin D, Ergocalciferol, (DRISDOL) 50000 units CAPS capsule Take 1 capsule (50,000 Units total) by mouth every 3 (three) days. 10 capsule 0  . Vitamins/Minerals TABS Take by mouth.    . [DISCONTINUED] omeprazole (PRILOSEC) 20 MG capsule Take 1 capsule (20 mg total) by mouth daily. (Patient not taking: Reported on 12/25/2013) 30 capsule 0  . [DISCONTINUED] promethazine (PHENERGAN) 12.5 MG suppository Place 1 suppository (12.5 mg total) rectally every 6 (six) hours as needed for nausea. (Patient not taking: Reported on 12/25/2013) 12 each 0   No current facility-administered medications on file prior to visit.     PAST MEDICAL HISTORY: Past Medical History:  Diagnosis Date  . Back pain   . Dyspnea   . Endometriosis   . GERD (gastroesophageal reflux disease)   . Joint pain   . Leg pain   . PONV (postoperative nausea and vomiting)     PAST SURGICAL HISTORY: Past Surgical History:  Procedure Laterality Date  . COLPOSCOPY  08/2012  . DILATION AND CURETTAGE OF UTERUS N/A 07/14/2016   Procedure: DILATATION AND CURETTAGE;  Surgeon: Servando Salina, MD;  Location: [facility];  Service: Gynecology;  Laterality: N/A;  . LAPAROSCOPY N/A 07/14/2016   Procedure: LAPAROSCOPY DIAGNOSTIC;  Surgeon: Servando Salina, MD;  Location: [facility];  Service: Gynecology;  Laterality: N/A;  .  OVARIAN CYST SURGERY  2003    SOCIAL HISTORY: Social History   Tobacco Use  . Smoking status: Current Some Day Smoker    Packs/day: 0.10    Types: Cigars  . Smokeless tobacco: Never Used  . Tobacco comment: SOCIALLY SMOKES BLACK AND MILD  Substance Use Topics  . Alcohol use: No  . Drug use: No    FAMILY HISTORY: Family History  Problem Relation Age of Onset  . Hypertension Maternal Aunt   . Hypertension Maternal Grandmother   . Hypertension Maternal Aunt   . Hyperlipidemia Mother   . Hypertension Mother   . Cancer Mother     ROS: Review of Systems  Gastrointestinal: Negative for nausea and vomiting.  Musculoskeletal:       Negative for muscle weakness    PHYSICAL EXAM: Pt in no acute distress  RECENT LABS AND TESTS: BMET    Component Value Date/Time   NA 140 12/20/2017 1212   K 4.5 12/20/2017 1212   CL 104 12/20/2017 1212   CO2 23 12/20/2017 1212   GLUCOSE 77 12/20/2017 1212   GLUCOSE 93 01/19/2014 1930   BUN 12 12/20/2017 1212   CREATININE 0.80 12/20/2017 1212   CALCIUM 9.4 12/20/2017  [place] 12/20/2017 1212   GFRAA 110 12/20/2017 1212   Lab Results  Component Value Date   HGBA1C 5.4 12/20/2017   HGBA1C 5.4 08/28/2017   Lab Results  Component Value Date   INSULIN 7.8 12/20/2017   INSULIN 8.0 08/28/2017   CBC    Component Value Date/Time   WBC 6.0 08/28/2017 1024   WBC 6.8 07/12/2016 1005   RBC 4.69 08/28/2017 1024   RBC 4.68 07/12/2016 1005   HGB 13.9 08/28/2017 1024   HCT 40.8 08/28/2017 1024   PLT 282 07/12/2016 1005   MCV 87 08/28/2017 1024   MCH 29.6 08/28/2017 1024   MCH 29.9 07/12/2016 1005   MCHC 34.1 08/28/2017 1024   MCHC 32.6 07/12/2016 1005   RDW 12.8 08/28/2017 1024   LYMPHSABS 1.4 08/28/2017 1024   MONOABS 0.6 01/19/2014 1930   EOSABS 0.1 08/28/2017 1024   BASOSABS 0.0 08/28/2017 1024   Iron/TIBC/Ferritin/ %Sat No results found for: IRON, TIBC, FERRITIN, IRONPCTSAT Lipid Panel     Component Value  Date/Time   CHOL 150 08/28/2017 1024   TRIG 63 08/28/2017 1024   HDL 49 08/28/2017 1024   LDLCALC 88 08/28/2017 1024   Hepatic Function Panel     Component Value Date/Time   PROT 7.0 12/20/2017 1212   ALBUMIN 4.4 12/20/2017 1212   AST 15 12/20/2017 1212   ALT 10 12/20/2017 1212   ALKPHOS 61 12/20/2017 1212   BILITOT 0.9 12/20/2017 1212   BILIDIR 0.1 12/09/2011 2320   IBILI 0.5 12/09/2011 2320      Component Value Date/Time   TSH 0.898 08/28/2017 1024   Results for LISSANDRA, KEIL (MRN [id number]) as of 05/01/2018 16:24  Ref. Range 12/20/2017 12:12  Vitamin D, 25-Hydroxy Latest Ref Range: 30.0 - 100.0 ng/mL 20.2 (L)     I, Cheryl Friedman, am acting as Location manager for [employer]. Owens Shark, DO  I have reviewed the above documentation for accuracy and completeness, and I agree with the above. -Jearld Lesch, DO

## 2018-05-02 ENCOUNTER — Encounter (INDEPENDENT_AMBULATORY_CARE_PROVIDER_SITE_OTHER): Payer: Self-pay | Admitting: Bariatrics

## 2018-05-15 ENCOUNTER — Encounter (INDEPENDENT_AMBULATORY_CARE_PROVIDER_SITE_OTHER): Payer: Self-pay | Admitting: Bariatrics

## 2018-05-15 ENCOUNTER — Ambulatory Visit (INDEPENDENT_AMBULATORY_CARE_PROVIDER_SITE_OTHER): Payer: 59 | Admitting: Bariatrics

## 2018-05-15 ENCOUNTER — Other Ambulatory Visit: Payer: Self-pay

## 2018-05-15 DIAGNOSIS — E559 Vitamin D deficiency, unspecified: Secondary | ICD-10-CM | POA: Diagnosis not present

## 2018-05-15 DIAGNOSIS — Z6836 Body mass index (BMI) 36.0-36.9, adult: Secondary | ICD-10-CM | POA: Diagnosis not present

## 2018-05-15 DIAGNOSIS — E8881 Metabolic syndrome: Secondary | ICD-10-CM

## 2018-05-15 MED ORDER — METFORMIN HCL 500 MG PO TABS: 500.0000 mg | ORAL_TABLET | Freq: Two times a day (BID) | ORAL | 0 refills | Status: AC

## 2018-05-16 DIAGNOSIS — L700 Acne vulgaris: Secondary | ICD-10-CM | POA: Diagnosis not present

## 2018-05-16 DIAGNOSIS — Z79899 Other long term (current) drug therapy: Secondary | ICD-10-CM | POA: Diagnosis not present

## 2018-05-16 NOTE — Progress Notes (Signed)
Office: [phone]  /  Fax: [phone] TeleHealth Visit:  Cheryl Friedman has verbally consented to this TeleHealth visit today. The patient is located at home, the provider is located at the [employer] [facility] office. The participants in this visit include the listed provider and patient and any and all parties involved. The visit was conducted today via FaceTime.  HPI:   Chief Complaint: OBESITY Cheryl Friedman is here to discuss her progress with her obesity treatment plan. She is on the Category 1 plan and is following her eating plan approximately 50 to 55 % of the time. She states she is doing Zumba 60 minutes 5 times per week. Cheryl Friedman has gained 2 pounds. She is doing okay. Her pants are getting loose. Cheryl Friedman is not stress eating and she is drinking more water. We were unable to weigh the patient today for this TeleHealth visit. She feels as if she has gained weight since her last visit.   Insulin Resistance Cheryl Friedman has a diagnosis of insulin resistance based on her elevated fasting insulin level >5. Although Cheryl Friedman's blood glucose readings are still under good control, insulin resistance puts her at greater risk of metabolic syndrome and diabetes. She is taking metformin currently and continues to work on diet and exercise to decrease risk of diabetes. Cheryl Friedman denies polyphagia.  Vitamin D deficiency Cheryl Friedman has a diagnosis of vitamin D deficiency. She is currently taking vit D and denies nausea, vomiting or muscle weakness.  ASSESSMENT AND PLAN:  Insulin resistance - Plan: metFORMIN (GLUCOPHAGE) 500 MG tablet  Vitamin D deficiency  Class 2 severe obesity with serious comorbidity and body mass index (BMI) of 36.0 to 36.9 in adult, unspecified obesity type ([place])  PLAN:  Insulin Resistance Cheryl Friedman will continue to work on weight loss, exercise, and decreasing simple carbohydrates in her diet to help decrease the risk of diabetes. We dicussed metformin including  benefits and risks. She was informed that eating too many simple carbohydrates or too many calories at one sitting increases the likelihood of GI side effects. Niylah agreed to continue metformin 500 mg BID #60 with no refills and follow up with [place] as directed to monitor her progress.  Vitamin D Deficiency Cheryl Friedman was informed that low vitamin D levels contributes to fatigue and are associated with obesity, breast, and colon cancer. She will continue prescription Vit D @50 ,000 IU every 3 days and will follow up for routine testing of vitamin D, at least 2-3 times per year. She was informed of the risk of over-replacement of vitamin D and agrees to not increase her dose unless she discusses this with [place] first.  Obesity Cheryl Friedman is currently in the action stage of change. As such, her goal is to continue with weight loss efforts She has agreed to follow the Category 1 plan Cheryl Friedman will continue her exercise regimen and doing weights for weight loss and overall health benefits. We discussed the following Behavioral Modification Strategies today: increase H2O intake, no skipping meals, keeping healthy foods in the home, increasing lean protein intake, decreasing simple carbohydrates, increasing vegetables, decrease eating out and work on meal planning and intentional eating Cheryl Friedman will weigh herself at home before each visit.  Cheryl Friedman has agreed to follow up with our clinic in 2 weeks. She was informed of the importance of frequent follow up visits to maximize her success with intensive lifestyle modifications for her multiple health conditions.  ALLERGIES: Allergies  Allergen Reactions  . Coconut Oil Swelling    Throat and eye swelling  .  Gadolinium Derivatives     Pt got nauseated and vomitted.  Inject slowly next time.  . Latex Swelling    gloves    MEDICATIONS: Current Outpatient Medications on File Prior to Visit  Medication Sig Dispense Refill  . ibuprofen (ADVIL,MOTRIN) 800 MG  tablet Take 1 tablet (800 mg total) by mouth every 6 (six) hours as needed for mild pain. 50 tablet 0  . naproxen sodium (ANAPROX) 550 MG tablet naproxen sodium 550 mg tablet  TAKE 1 TABLET EVERY 12 HOURS AS NEEDED    . norethindrone-ethinyl estradiol (FEMHRT 1/5) 1-5 MG-MCG TABS tablet Take 1 tablet by mouth daily. (Patient taking differently: Take 1 tablet by mouth at bedtime. ) 28 tablet 0  . Vitamin D, Ergocalciferol, (DRISDOL) 50000 units CAPS capsule Take 1 capsule (50,000 Units total) by mouth every 3 (three) days. 10 capsule 0  . Vitamins/Minerals TABS Take by mouth.    . [DISCONTINUED] omeprazole (PRILOSEC) 20 MG capsule Take 1 capsule (20 mg total) by mouth daily. (Patient not taking: Reported on 12/25/2013) 30 capsule 0  . [DISCONTINUED] promethazine (PHENERGAN) 12.5 MG suppository Place 1 suppository (12.5 mg total) rectally every 6 (six) hours as needed for nausea. (Patient not taking: Reported on 12/25/2013) 12 each 0   No current facility-administered medications on file prior to visit.     PAST MEDICAL HISTORY: Past Medical History:  Diagnosis Date  . Back pain   . Dyspnea   . Endometriosis   . GERD (gastroesophageal reflux disease)   . Joint pain   . Leg pain   . PONV (postoperative nausea and vomiting)     PAST SURGICAL HISTORY: Past Surgical History:  Procedure Laterality Date  . COLPOSCOPY  08/2012  . DILATION AND CURETTAGE OF UTERUS N/A 07/14/2016   Procedure: DILATATION AND CURETTAGE;  Surgeon: Servando Salina, MD;  Location: [facility];  Service: Gynecology;  Laterality: N/A;  . LAPAROSCOPY N/A 07/14/2016   Procedure: LAPAROSCOPY DIAGNOSTIC;  Surgeon: Servando Salina, MD;  Location: [facility];  Service: Gynecology;  Laterality: N/A;  . OVARIAN CYST SURGERY  2003    SOCIAL HISTORY: Social History   Tobacco Use  . Smoking status: Current Some Day Smoker    Packs/day: 0.10    Types: Cigars  . Smokeless tobacco: Never Used  . Tobacco comment: SOCIALLY SMOKES  BLACK AND MILD  Substance Use Topics  . Alcohol use: No  . Drug use: No    FAMILY HISTORY: Family History  Problem Relation Age of Onset  . Hypertension Maternal Aunt   . Hypertension Maternal Grandmother   . Hypertension Maternal Aunt   . Hyperlipidemia Mother   . Hypertension Mother   . Cancer Mother     ROS: Review of Systems  Constitutional: Negative for weight loss.  Gastrointestinal: Negative for nausea and vomiting.  Musculoskeletal:       Negative for muscle weakness  Endo/Heme/Allergies:       Negative for polyphagia    PHYSICAL EXAM: Pt in no acute distress  RECENT LABS AND TESTS: BMET    Component Value Date/Time   NA 140 12/20/2017 1212   K 4.5 12/20/2017 1212   CL 104 12/20/2017 1212   CO2 23 12/20/2017 1212   GLUCOSE 77 12/20/2017 1212   GLUCOSE 93 01/19/2014 1930   BUN 12 12/20/2017 1212   CREATININE 0.80 12/20/2017 1212   CALCIUM 9.4 12/20/2017 1212   GFRNONAA 95 12/20/2017 1212   GFRAA 110 12/20/2017 1212   Lab Results  Component Value  Date   HGBA1C 5.4 12/20/2017   HGBA1C 5.4 08/28/2017   Lab Results  Component Value Date   INSULIN 7.8 12/20/2017   INSULIN 8.0 08/28/2017   CBC    Component Value Date/Time   WBC 6.0 08/28/2017 1024   WBC 6.8 07/12/2016 1005   RBC 4.69 08/28/2017 1024   RBC 4.68 07/12/2016 1005   HGB 13.9 08/28/2017 1024   HCT 40.8 08/28/2017 1024   PLT 282 07/12/2016 1005   MCV 87 08/28/2017 1024   MCH 29.6 08/28/2017 1024   MCH 29.9 07/12/2016 1005   MCHC 34.1 08/28/2017 1024   MCHC 32.6 07/12/2016 1005   RDW 12.8 08/28/2017 1024   LYMPHSABS 1.4 08/28/2017 1024   MONOABS 0.6 01/19/2014 1930   EOSABS 0.1 08/28/2017 1024   BASOSABS 0.0 08/28/2017 1024   Iron/TIBC/Ferritin/ %Sat No results found for: IRON, TIBC, FERRITIN, IRONPCTSAT Lipid Panel     Component Value Date/Time   CHOL 150 08/28/2017 1024   TRIG 63 08/28/2017 1024   HDL 49 08/28/2017 1024   LDLCALC 88 08/28/2017 1024   Hepatic Function  Panel     Component Value Date/Time   PROT 7.0 12/20/2017 1212   ALBUMIN 4.4 12/20/2017 1212   AST 15 12/20/2017 1212   ALT 10 12/20/2017 1212   ALKPHOS 61 12/20/2017 1212   BILITOT 0.9 12/20/2017 1212   BILIDIR 0.1 12/09/2011 2320   IBILI 0.5 12/09/2011 2320      Component Value Date/Time   TSH 0.898 08/28/2017 1024      Ref. Range 12/20/2017 12:12  Vitamin D, 25-Hydroxy Latest Ref Range: 30.0 - 100.0 ng/mL 20.2 (L)    I, Doreene Nest, am acting as Location manager for [employer]. Owens Shark, DO  I have reviewed the above documentation for accuracy and completeness, and I agree with the above. -Jearld Lesch, DO

## 2018-05-17 DIAGNOSIS — L700 Acne vulgaris: Secondary | ICD-10-CM | POA: Diagnosis not present

## 2018-05-23 ENCOUNTER — Telehealth: Payer: 59 | Admitting: Nurse Practitioner

## 2018-05-23 DIAGNOSIS — R059 Cough, unspecified: Secondary | ICD-10-CM

## 2018-05-23 DIAGNOSIS — R05 Cough: Secondary | ICD-10-CM | POA: Diagnosis not present

## 2018-05-23 MED ORDER — PREDNISONE 10 MG (21) PO TBPK: ORAL_TABLET | ORAL | 0 refills | Status: DC

## 2018-05-23 NOTE — Progress Notes (Signed)
We are sorry that you are not feeling well.  Here is how we plan to help!  Based on your presentation I believe you most likely have A cough due to a virus.  This is called viral bronchitis and is best treated by rest, plenty of fluids and control of the cough.  You may use Ibuprofen or Tylenol as directed to help your symptoms.     In addition you may use A non-prescription cough medication called Mucinex DM: take 2 tablets every 12 hours.  Prednisone 10 mg daily for 6 days (see taper instructions below)  Directions for 6 day taper: Day 1: 2 tablets before breakfast, 1 after both lunch & dinner and 2 at bedtime Day 2: 1 tab before breakfast, 1 after both lunch & dinner and 2 at bedtime Day 3: 1 tab at each meal & 1 at bedtime Day 4: 1 tab at breakfast, 1 at lunch, 1 at bedtime Day 5: 1 tab at breakfast & 1 tab at bedtime Day 6: 1 tab at breakfast   From your responses in the eVisit questionnaire you describe inflammation in the upper respiratory tract which is causing a significant cough.  This is commonly called Bronchitis and has four common causes:    Allergies  Viral Infections  Acid Reflux  Bacterial Infection Allergies, viruses and acid reflux are treated by controlling symptoms or eliminating the cause. An example might be a cough caused by taking certain blood pressure medications. You stop the cough by changing the medication. Another example might be a cough caused by acid reflux. Controlling the reflux helps control the cough.  USE OF BRONCHODILATOR ("RESCUE") INHALERS: There is a risk from using your bronchodilator too frequently.  The risk is that over-reliance on a medication which only relaxes the muscles surrounding the breathing tubes can reduce the effectiveness of medications prescribed to reduce swelling and congestion of the tubes themselves.  Although you feel brief relief from the bronchodilator inhaler, your asthma may actually be worsening with the tubes  becoming more swollen and filled with mucus.  This can delay other crucial treatments, such as oral steroid medications. If you need to use a bronchodilator inhaler daily, several times per day, you should discuss this with your provider.  There are probably better treatments that could be used to keep your asthma under control.     HOME CARE . Only take medications as instructed by your medical team. . Complete the entire course of an antibiotic. . Drink plenty of fluids and get plenty of rest. . Avoid close contacts especially the very young and the elderly . Cover your mouth if you cough or cough into your sleeve. . Always remember to wash your hands . A steam or ultrasonic humidifier can help congestion.   GET HELP RIGHT AWAY IF: . You develop worsening fever. . You become short of breath . You cough up blood. . Your symptoms persist after you have completed your treatment plan MAKE SURE YOU   Understand these instructions.  Will watch your condition.  Will get help right away if you are not doing well or get worse.  Your e-visit answers were reviewed by a board certified advanced clinical practitioner to complete your personal care plan.  Depending on the condition, your plan could have included both over the counter or prescription medications. If there is a problem please reply  once you have received a response from your provider. Your safety is important to [place].  If you  have drug allergies check your prescription carefully.    You can use MyChart to ask questions about today's visit, request a non-urgent call back, or ask for a work or school excuse for 24 hours related to this e-Visit. If it has been greater than 24 hours you will need to follow up with your provider, or enter a new e-Visit to address those concerns. You will get an e-mail in the next two days asking about your experience.  I hope that your e-visit has been valuable and will speed your recovery. Thank you for  using e-visits.  5-10 minutes spent reviewing and documenting in chart.

## 2018-05-29 ENCOUNTER — Encounter (INDEPENDENT_AMBULATORY_CARE_PROVIDER_SITE_OTHER): Payer: Self-pay

## 2018-05-29 ENCOUNTER — Ambulatory Visit (INDEPENDENT_AMBULATORY_CARE_PROVIDER_SITE_OTHER): Payer: Self-pay | Admitting: Bariatrics

## 2018-06-13 ENCOUNTER — Telehealth: Payer: 59 | Admitting: Family

## 2018-06-13 DIAGNOSIS — R062 Wheezing: Secondary | ICD-10-CM

## 2018-06-13 DIAGNOSIS — R079 Chest pain, unspecified: Secondary | ICD-10-CM

## 2018-06-13 DIAGNOSIS — R059 Cough, unspecified: Secondary | ICD-10-CM

## 2018-06-13 DIAGNOSIS — R05 Cough: Secondary | ICD-10-CM

## 2018-06-13 NOTE — Progress Notes (Signed)
Based on what you shared with me, I feel your condition warrants further evaluation and I recommend that you be seen for a face to face office visit.  Based on your symptoms you need to be seen face-to-face to be evaluated today.   NOTE: If you entered your credit card information for this eVisit, you will not be charged. You may see a "hold" on your card for the $35 but that hold will drop off and you will not have a charge processed.  If you are having a true medical emergency please call 911.  If you need an urgent face to face visit, [facility] has four urgent care centers for your convenience.    PLEASE NOTE: THE [facility] LOCATIONS AND URGENT CARE CLINICS DO NOT HAVE THE TESTING FOR CORONAVIRUS COVID19 AVAILABLE.  IF YOU FEEL YOU NEED THIS TEST YOU MUST GO TO A TRIAGE LOCATION AT [facility]   [email or web address] to reserve your spot online an avoid wait times  [facility] [address], [address] [place], [place] [address] Modified hours of operation: Monday-Friday, 12 PM to 6 PM  Saturday & Sunday 10 AM to 4 PM *Across the street from [place] (New Address!) [address], [place], [place] [address] *Just off [address], across the road from [place] hours of operation: Monday-Friday, 12 PM to 6 PM  Closed Saturday & Sunday  [facility]'s modified hours of operation will be in effect from May 1 until May 31   The following sites will take your insurance:  . [facility] [place] a Provider at this Location  [address] [place], [place] [address] . 10 am to 8 pm Monday-Friday . 12 pm to 8 pm Saturday-Sunday   . [facility] at [place] a Provider at this Location  [address], [place], [place] [address] . 8 am to 8 pm  Monday-Friday . 9 am to 6 pm Saturday . 11 am to 6 pm Sunday   . [facility] at [place] Get Driving Directions  [address] [place], [place] [address] . 8 am to 8 pm Monday-Friday . 8 am to 4 pm Saturday-Sunday   Your e-visit answers were reviewed by a board certified advanced clinical practitioner to complete your personal care plan.  Thank you for using e-Visits.

## 2018-06-14 DIAGNOSIS — L700 Acne vulgaris: Secondary | ICD-10-CM | POA: Diagnosis not present

## 2019-02-24 ENCOUNTER — Telehealth: Payer: 59 | Admitting: Family

## 2019-02-24 DIAGNOSIS — J069 Acute upper respiratory infection, unspecified: Secondary | ICD-10-CM | POA: Diagnosis not present

## 2019-02-24 MED ORDER — PREDNISONE 10 MG (21) PO TBPK: ORAL_TABLET | ORAL | 0 refills | Status: AC

## 2019-02-24 MED ORDER — FLUTICASONE PROPIONATE 50 MCG/ACT NA SUSP: 2.0000 | Freq: Every day | NASAL | 6 refills | Status: AC

## 2019-02-24 MED ORDER — BENZONATATE 200 MG PO CAPS: 200.0000 mg | ORAL_CAPSULE | Freq: Two times a day (BID) | ORAL | 0 refills | Status: AC | PRN

## 2019-02-24 MED ORDER — ALBUTEROL SULFATE HFA 108 (90 BASE) MCG/ACT IN AERS: 1.0000 | INHALATION_SPRAY | RESPIRATORY_TRACT | 0 refills | Status: AC | PRN

## 2019-02-24 NOTE — Progress Notes (Signed)
E-Visit for Corona Virus Screening  Your current symptoms could be consistent with the coronavirus.  Many health care providers can now test patients at their office but not all are.  [place] has multiple testing sites. For information on our COVID testing locations and hours go to [email or web address]  We are enrolling you in our MyChart Home Monitoring for COVID19 . Daily you will receive a questionnaire within the MyChart website. Our COVID 19 response team will be monitoring your responses daily.  Testing Information: The COVID-19 Community Testing sites will begin testing BY APPOINTMENT ONLY.  You can schedule online at [email or web address]  If you do not have access to a smart phone or computer you may call [phone] for an appointment.   Additional testing sites in the Community:  . For [facility] Testing sites in [place]  https://www.cvs.com/minuteclinic/covid-19-testing  . For Pop-up testing sites in   https://covid19.ncdhhs.gov/about-covid-19/testing/find-my-testing-place/pop-testing-sites  . For Testing sites with regular hours https://onsms.org/[place]/  . For [place] [email or web address]://[email or web address]-[email or web address]-community-outreach-[email or web address]  . For [facility] https://www.[place].gov[place]/human-services/health-department/coronavirus-covid-19-info/covid-19-testing  . For [place] testing in [place] and [place] https://www.[place].gov/[place]/human-services/health-department/coronavirus-covid-19-info/covid-19-testing  . For [facility] testing in [place]   [email or web address]  For  more information about community testing call [phone]   Please quarantine yourself while awaiting your test results. Please stay home for a minimum of 10 days from the first day of illness with improving symptoms and you have had 24 hours of no fever (without the use of Tylenol (Acetaminophen)  Motrin (Ibuprofen) or any fever reducing medication).  Also - Do not get tested prior to returning to work because once you have had a positive test the test can stay positive for more then a month in some cases.   You should wear a mask or cloth face covering over your nose and mouth if you must be around other people or animals, including pets (even at home). Try to stay at least 6 feet away from other people. This will protect the people around you.  Please continue good preventive care measures, including:  frequent hand-washing, avoid touching your face, cover coughs/sneezes, stay out of crowds and keep a 6 foot distance from others.  COVID-19 is a respiratory illness with symptoms that are similar to the flu. Symptoms are typically mild to moderate, but there have been cases of severe illness and death due to the virus.   The following symptoms may appear 2-14 days after exposure: . Fever . Cough . Shortness of breath or difficulty breathing . Chills . Repeated shaking with chills . Muscle pain . Headache . Sore throat . New loss of taste or smell . Fatigue . Congestion or runny nose . Nausea or vomiting . Diarrhea  Go to the nearest hospital ED for assessment if fever/cough/breathlessness are severe or illness seems like a threat to life.  It is vitally important that if you feel that you have an infection such as this virus or any other virus that you stay home and away from places where you may spread it to others.  You should avoid contact with people age 65 and older.   You can use medication such as A prescription cough medication called Tessalon Perles 100 mg. You may take 1-2 capsules every 8 hours as needed for cough.  You may also take acetaminophen (Tylenol) as needed for fever.  Reduce your risk of any infection by using the same precautions used for avoiding the common cold or flu:  . Wash your hands   often with soap and warm water for at least 20 seconds.  If soap and  water are not readily available, use an alcohol-based hand sanitizer with at least 60% alcohol.  . If coughing or sneezing, cover your mouth and nose by coughing or sneezing into the elbow areas of your shirt or coat, into a tissue or into your sleeve (not your hands). . Avoid shaking hands with others and consider head nods or verbal greetings only. . Avoid touching your eyes, nose, or mouth with unwashed hands.  . Avoid close contact with people who are sick. . Avoid places or events with large numbers of people in one location, like concerts or sporting events. . Carefully consider travel plans you have or are making. . If you are planning any travel outside or inside the [place], visit the CDC's Travelers' Health webpage for the latest health notices. . If you have some symptoms but not all symptoms, continue to monitor at home and seek medical attention if your symptoms worsen. . If you are having a medical emergency, call 911.  HOME CARE . Only take medications as instructed by your medical team. . Drink plenty of fluids and get plenty of rest. . A steam or ultrasonic humidifier can help if you have congestion.   GET HELP RIGHT AWAY IF YOU HAVE EMERGENCY WARNING SIGNS** FOR COVID-19. If you or someone is showing any of these signs seek emergency medical care immediately. Call 911 or proceed to your closest emergency facility if: . You develop worsening high fever. . Trouble breathing . Bluish lips or face . Persistent pain or pressure in the chest . New confusion . Inability to wake or stay awake . You cough up blood. . Your symptoms become more severe  **This list is not all possible symptoms. Contact your medical provider for any symptoms that are sever or concerning to you.  MAKE SURE YOU   Understand these instructions.  Will watch your condition.  Will get help right away if you are not doing well or get worse.  Your e-visit answers were reviewed by a board certified  advanced clinical practitioner to complete your personal care plan.  Depending on the condition, your plan could have included both over the counter or prescription medications.  If there is a problem please reply once you have received a response from your provider.  Your safety is important to us.  If you have drug allergies check your prescription carefully.    You can use MyChart to ask questions about today's visit, request a non-urgent call back, or ask for a work or school excuse for 24 hours related to this e-Visit. If it has been greater than 24 hours you will need to follow up with your provider, or enter a new e-Visit to address those concerns. You will get an e-mail in the next two days asking about your experience.  I hope that your e-visit has been valuable and will speed your recovery. Thank you for using e-visits.  Approximately 5 minutes was spent documenting and reviewing patient's chart.    

## 2019-02-24 NOTE — Progress Notes (Signed)
We are sorry you are not feeling well.  Here is how we plan to help!  Based on what you have shared with me, it looks like you may have a viral upper respiratory infection.   It is also my recommendation that you be tested for COVID 19 if you have not done so, Please see the information at the end of this note.  Upper respiratory infections are caused by a large number of viruses; however, rhinovirus is the most common cause.   Symptoms vary from person to person, with common symptoms including sore throat, cough, fatigue or lack of energy and feeling of general discomfort.  A low-grade fever of up to 100.4 may present, but is often uncommon.  Symptoms vary however, and are closely related to a person's age or underlying illnesses.  The most common symptoms associated with an upper respiratory infection are nasal discharge or congestion, cough, sneezing, headache and pressure in the ears and face.  These symptoms usually persist for about 3 to 10 days, but can last up to 2 weeks.  It is important to know that upper respiratory infections do not cause serious illness or complications in most cases.    Upper respiratory infections can be transmitted from person to person, with the most common method of transmission being a person's hands.  The virus is able to live on the skin and can infect other persons for up to 2 hours after direct contact.  Also, these can be transmitted when someone coughs or sneezes; thus, it is important to cover the mouth to reduce this risk.  To keep the spread of the illness at [place], good hand hygiene is very important.  This is an infection that is most likely caused by a virus. There are no specific treatments other than to help you with the symptoms until the infection runs its course.  Antibiotics are not indicated for treatment of a viral infection. We are sorry you are not feeling well.  Here is how we plan to help!   For nasal congestion, you may use an oral  decongestants such as Mucinex D or if you have glaucoma or high blood pressure use plain Mucinex.  Saline nasal spray or nasal drops can help and can safely be used as often as needed for congestion.  For your congestion, I have prescribed Fluticasone nasal spray one spray in each nostril twice a day  If you do not have a history of heart disease, hypertension, diabetes or thyroid disease, prostate/bladder issues or glaucoma, you may also use Sudafed to treat nasal congestion.  It is highly recommended that you consult with a pharmacist or your primary care physician to ensure this medication is safe for you to take.     If you have a cough, you may use cough suppressants such as Delsym and Robitussin.  If you have glaucoma or high blood pressure, you can also use Coricidin HBP.   For cough I have prescribed for you A prescription cough medication called Tessalon Perles 100 mg. You may take 1-2 capsules every 8 hours as needed for cough and an Albuterol inhaler.  If you have a sore or scratchy throat, use a saltwater gargle-  to  teaspoon of salt dissolved in a 4-ounce to 8-ounce glass of warm water.  Gargle the solution for approximately 15-30 seconds and then spit.  It is important not to swallow the solution.  You can also use throat lozenges/cough drops and Chloraseptic spray to help with  throat pain or discomfort.  Warm or cold liquids can also be helpful in relieving throat pain.  For headache, pain or general discomfort, you can use Ibuprofen or Tylenol as directed.   Some authorities believe that zinc sprays or the use of Echinacea may shorten the course of your symptoms.   HOME CARE . Only take medications as instructed by your medical team. . Be sure to drink plenty of fluids. Water is fine as well as fruit juices, sodas and electrolyte beverages. You may want to stay away from caffeine or alcohol. If you are nauseated, try taking small sips of liquids. How do you know if you are getting  enough fluid? Your urine should be a pale yellow or almost colorless. . Get rest. . Taking a steamy shower or using a humidifier may help nasal congestion and ease sore throat pain. You can place a towel over your head and breathe in the steam from hot water coming from a faucet. . Using a saline nasal spray works much the same way. . Cough drops, hard candies and sore throat lozenges may ease your cough. . Avoid close contacts especially the very young and the elderly . Cover your mouth if you cough or sneeze . Always remember to wash your hands.   GET HELP RIGHT AWAY IF: . You develop worsening fever. . If your symptoms do not improve within 10 days . You develop yellow or green discharge from your nose over 3 days. . You have coughing fits . You develop a severe head ache or visual changes. . You develop shortness of breath, difficulty breathing or start having chest pain . Your symptoms persist after you have completed your treatment plan  MAKE SURE YOU   Understand these instructions.  Will watch your condition.  Will get help right away if you are not doing well or get worse.  Your e-visit answers were reviewed by a board certified advanced clinical practitioner to complete your personal care plan. Depending upon the condition, your plan could have included both over the counter or prescription medications. Please review your pharmacy choice. If there is a problem, you may call our nursing hot line at and have the prescription routed to another pharmacy. Your safety is important to [place]. If you have drug allergies check your prescription carefully.   You can use MyChart to ask questions about today's visit, request a non-urgent call back, or ask for a work or school excuse for 24 hours related to this e-Visit. If it has been greater than 24 hours you will need to follow up with your provider, or enter a new e-Visit to address those concerns. You will get an e-mail in the next two  days asking about your experience.  I hope that your e-visit has been valuable and will speed your recovery. Thank you for using e-visits.  E-Visit for Corona Virus Screening  Your current symptoms could be consistent with the coronavirus.  Many health care providers can now test patients at their office but not all are.  [place] has multiple testing sites. For information on our [place] testing locations and hours go to [email or web address]  We are enrolling you in our [place] for [place] . Daily you will receive a questionnaire within the [place] website. Our COVID 19 response team will be monitoring your responses daily.  Testing Information: The COVID-19 Community Testing sites will begin testing BY APPOINTMENT ONLY.  You can schedule online at [email or web address]  If you do  not have access to a smart phone or computer you may call [phone] for an appointment.   Additional testing sites in the Community:  . For [facility] Testing sites in [facility]  FaceUpdate.[email or web address]  . For Pop-up testing sites in [place]  BowlDirectory.co.uk  . For Testing sites with regular hours https://onsms.org/[email or web address]  . For [place] [email or web address].si  . For [facility] [email or web address]  . For [facility] testing in [place] and [employer] [email or web address]  . For [facility] testing in [facility]   [email or web address]  For  more information about community testing call [phone]   Please quarantine yourself while awaiting your test results. Please stay home for a minimum of 10  days from the first day of illness with improving symptoms and you have had 24 hours of no fever (without the use of Tylenol (Acetaminophen) Motrin (Ibuprofen) or any fever reducing medication).  Also - Do not get tested prior to returning to work because once you have had a positive test the test can stay positive for more then a month in some cases.   You should wear a mask or cloth face covering over your nose and mouth if you must be around other people or animals, including pets (even at home). Try to stay at least 6 feet away from other people. This will protect the people around you.  Please continue good preventive care measures, including:  frequent hand-washing, avoid touching your face, cover coughs/sneezes, stay out of crowds and keep a 6 foot distance from others.  COVID-19 is a respiratory illness with symptoms that are similar to the flu. Symptoms are typically mild to moderate, but there have been cases of severe illness and death due to the virus.   The following symptoms may appear 2-14 days after exposure: . Fever . Cough . Shortness of breath or difficulty breathing . Chills . Repeated shaking with chills . Muscle pain . Headache . Sore throat . New loss of taste or smell . Fatigue . Congestion or runny nose . Nausea or vomiting . Diarrhea  Go to the nearest hospital ED for assessment if fever/cough/breathlessness are severe or illness seems like a threat to life.  It is vitally important that if you feel that you have an infection such as this virus or any other virus that you stay home and away from places where you may spread it to others.  You should avoid contact with people age 37 and older.     You may also take acetaminophen (Tylenol) as needed for fever.  Reduce your risk of any infection by using the same precautions used for avoiding the common cold or flu:  [place] Wash your hands often with soap and warm water for at least 20 seconds.  If soap and water are not  readily available, use an alcohol-based hand sanitizer with at least 60% alcohol.  . If coughing or sneezing, cover your mouth and nose by coughing or sneezing into the elbow areas of your shirt or coat, into a tissue or into your sleeve (not your hands). . Avoid shaking hands with others and consider head nods or verbal greetings only. . Avoid touching your eyes, nose, or mouth with unwashed hands.  . Avoid close contact with people who are sick. . Avoid places or events with large numbers of people in one location, like concerts or sporting events. . Carefully consider travel plans you have or are making. . If you are planning any travel  outside or inside the [place], visit the CDC's Travelers' Health webpage for the latest health notices. . If you have some symptoms but not all symptoms, continue to monitor at home and seek medical attention if your symptoms worsen. . If you are having a medical emergency, call 911.  HOME CARE . Only take medications as instructed by your medical team. . Drink plenty of fluids and get plenty of rest. . A steam or ultrasonic humidifier can help if you have congestion.   GET HELP RIGHT AWAY IF YOU HAVE EMERGENCY WARNING SIGNS** FOR COVID-19. If you or someone is showing any of these signs seek emergency medical care immediately. Call 911 or proceed to your closest emergency facility if: . You develop worsening high fever. . Trouble breathing . Bluish lips or face . Persistent pain or pressure in the chest . New confusion . Inability to wake or stay awake . You cough up blood. . Your symptoms become more severe  **This list is not all possible symptoms. Contact your medical provider for any symptoms that are sever or concerning to you.  MAKE SURE YOU   Understand these instructions.  Will watch your condition.  Will get help right away if you are not doing well or get worse.  Your e-visit answers were reviewed by a board certified advanced clinical  practitioner to complete your personal care plan.  Depending on the condition, your plan could have included both over the counter or prescription medications.  If there is a problem please reply once you have received a response from your provider.  Your safety is important to [place].  If you have drug allergies check your prescription carefully.    You can use MyChart to ask questions about today's visit, request a non-urgent call back, or ask for a work or school excuse for 24 hours related to this e-Visit. If it has been greater than 24 hours you will need to follow up with your provider, or enter a new e-Visit to address those concerns. You will get an e-mail in the next two days asking about your experience.  I hope that your e-visit has been valuable and will speed your recovery. Thank you for using e-visits.  Greater than 5 but less than 10 minutes spent researching, coordinating, and implementing care for this patient today

## 2023-08-28 DIAGNOSIS — Z01419 Encounter for gynecological examination (general) (routine) without abnormal findings: Secondary | ICD-10-CM | POA: Diagnosis not present

## 2023-08-28 DIAGNOSIS — Z114 Encounter for screening for human immunodeficiency virus [HIV]: Secondary | ICD-10-CM | POA: Diagnosis not present

## 2023-08-28 DIAGNOSIS — Z1159 Encounter for screening for other viral diseases: Secondary | ICD-10-CM | POA: Diagnosis not present

## 2023-08-28 DIAGNOSIS — Z113 Encounter for screening for infections with a predominantly sexual mode of transmission: Secondary | ICD-10-CM | POA: Diagnosis not present

## 2023-08-28 DIAGNOSIS — N870 Mild cervical dysplasia: Secondary | ICD-10-CM | POA: Diagnosis not present

## 2023-08-28 DIAGNOSIS — N809 Endometriosis, unspecified: Secondary | ICD-10-CM | POA: Diagnosis not present

## 2023-10-25 DIAGNOSIS — N923 Ovulation bleeding: Secondary | ICD-10-CM | POA: Diagnosis not present

## 2023-10-25 DIAGNOSIS — Z113 Encounter for screening for infections with a predominantly sexual mode of transmission: Secondary | ICD-10-CM | POA: Diagnosis not present

## 2023-10-25 DIAGNOSIS — Z1159 Encounter for screening for other viral diseases: Secondary | ICD-10-CM | POA: Diagnosis not present

## 2023-10-25 DIAGNOSIS — Z114 Encounter for screening for human immunodeficiency virus [HIV]: Secondary | ICD-10-CM | POA: Diagnosis not present
